# Patient Record
Sex: Male | Born: 1960
Health system: Southern US, Community
[De-identification: ages and names within clinical notes are randomized; demographics above are authoritative.]

## PROBLEM LIST (undated history)

## (undated) DIAGNOSIS — M169 Osteoarthritis of hip, unspecified: Secondary | ICD-10-CM

## (undated) DIAGNOSIS — E785 Hyperlipidemia, unspecified: Secondary | ICD-10-CM

## (undated) DIAGNOSIS — K589 Irritable bowel syndrome without diarrhea: Secondary | ICD-10-CM

## (undated) DIAGNOSIS — K219 Gastro-esophageal reflux disease without esophagitis: Secondary | ICD-10-CM

## (undated) DIAGNOSIS — F419 Anxiety disorder, unspecified: Secondary | ICD-10-CM

## (undated) DIAGNOSIS — I255 Ischemic cardiomyopathy: Secondary | ICD-10-CM

## (undated) DIAGNOSIS — I2102 ST elevation (STEMI) myocardial infarction involving left anterior descending coronary artery: Secondary | ICD-10-CM

## (undated) DIAGNOSIS — G479 Sleep disorder, unspecified: Secondary | ICD-10-CM

## (undated) DIAGNOSIS — R109 Unspecified abdominal pain: Secondary | ICD-10-CM

## (undated) DIAGNOSIS — I2510 Atherosclerotic heart disease of native coronary artery without angina pectoris: Secondary | ICD-10-CM

## (undated) DIAGNOSIS — F4323 Adjustment disorder with mixed anxiety and depressed mood: Secondary | ICD-10-CM

## (undated) DIAGNOSIS — R739 Hyperglycemia, unspecified: Secondary | ICD-10-CM

## (undated) DIAGNOSIS — F17200 Nicotine dependence, unspecified, uncomplicated: Secondary | ICD-10-CM

## (undated) HISTORY — DX: Hyperglycemia, unspecified: R73.9

## (undated) HISTORY — DX: Osteoarthritis of hip, unspecified: M16.9

## (undated) HISTORY — DX: ST elevation (STEMI) myocardial infarction involving left anterior descending coronary artery: I21.02

## (undated) HISTORY — DX: Ischemic cardiomyopathy: I25.5

## (undated) HISTORY — DX: Unspecified abdominal pain: R10.9

## (undated) HISTORY — DX: Hyperlipidemia, unspecified: E78.5

## (undated) HISTORY — PX: POLYPECTOMY: SHX149

## (undated) HISTORY — DX: Gastro-esophageal reflux disease without esophagitis: K21.9

## (undated) HISTORY — DX: Sleep disorder, unspecified: G47.9

## (undated) HISTORY — DX: Irritable bowel syndrome, unspecified: K58.9

## (undated) HISTORY — DX: Anxiety disorder, unspecified: F41.9

## (undated) HISTORY — DX: Adjustment disorder with mixed anxiety and depressed mood: F43.23

## (undated) HISTORY — DX: Atherosclerotic heart disease of native coronary artery without angina pectoris: I25.10

## (undated) HISTORY — PX: OTHER SURGICAL HISTORY: SHX169

## (undated) HISTORY — PX: CARDIAC CATHETERIZATION: SHX172

## (undated) HISTORY — DX: Nicotine dependence, unspecified, uncomplicated: F17.200

## (undated) HISTORY — PX: ROTATOR CUFF REPAIR: SHX139

---

## 1998-04-01 ENCOUNTER — Encounter: Payer: Self-pay | Admitting: Emergency Medicine

## 1998-04-01 ENCOUNTER — Emergency Department (HOSPITAL_COMMUNITY): Admission: EM | Admit: 1998-04-01 | Discharge: 1998-04-01 | Payer: Self-pay | Admitting: Emergency Medicine

## 1999-10-04 ENCOUNTER — Encounter: Payer: Self-pay | Admitting: Emergency Medicine

## 1999-10-04 ENCOUNTER — Emergency Department (HOSPITAL_COMMUNITY): Admission: EM | Admit: 1999-10-04 | Discharge: 1999-10-04 | Payer: Self-pay | Admitting: Emergency Medicine

## 1999-10-14 ENCOUNTER — Emergency Department (HOSPITAL_COMMUNITY): Admission: EM | Admit: 1999-10-14 | Discharge: 1999-10-15 | Payer: Self-pay | Admitting: Emergency Medicine

## 2001-03-01 ENCOUNTER — Ambulatory Visit (HOSPITAL_COMMUNITY): Admission: RE | Admit: 2001-03-01 | Discharge: 2001-03-01 | Payer: Self-pay | Admitting: Gastroenterology

## 2001-03-09 ENCOUNTER — Encounter: Payer: Self-pay | Admitting: Gastroenterology

## 2001-03-09 ENCOUNTER — Ambulatory Visit (HOSPITAL_COMMUNITY): Admission: RE | Admit: 2001-03-09 | Discharge: 2001-03-09 | Payer: Self-pay | Admitting: Gastroenterology

## 2001-06-16 ENCOUNTER — Encounter: Payer: Self-pay | Admitting: Gastroenterology

## 2001-06-16 ENCOUNTER — Ambulatory Visit (HOSPITAL_COMMUNITY): Admission: RE | Admit: 2001-06-16 | Discharge: 2001-06-16 | Payer: Self-pay | Admitting: Gastroenterology

## 2001-06-21 ENCOUNTER — Encounter: Admission: RE | Admit: 2001-06-21 | Discharge: 2001-06-21 | Payer: Self-pay | Admitting: Gastroenterology

## 2001-06-21 ENCOUNTER — Encounter: Payer: Self-pay | Admitting: Gastroenterology

## 2003-08-03 ENCOUNTER — Emergency Department (HOSPITAL_COMMUNITY): Admission: EM | Admit: 2003-08-03 | Discharge: 2003-08-04 | Payer: Self-pay | Admitting: Emergency Medicine

## 2004-02-12 ENCOUNTER — Ambulatory Visit: Admission: RE | Admit: 2004-02-12 | Discharge: 2004-02-12 | Payer: Self-pay | Admitting: Internal Medicine

## 2004-04-10 ENCOUNTER — Ambulatory Visit: Payer: Self-pay | Admitting: Pulmonary Disease

## 2004-04-10 ENCOUNTER — Ambulatory Visit (HOSPITAL_BASED_OUTPATIENT_CLINIC_OR_DEPARTMENT_OTHER): Admission: RE | Admit: 2004-04-10 | Discharge: 2004-04-10 | Payer: Self-pay | Admitting: Pulmonary Disease

## 2005-04-06 ENCOUNTER — Ambulatory Visit: Payer: Self-pay | Admitting: Internal Medicine

## 2005-04-22 ENCOUNTER — Ambulatory Visit: Payer: Self-pay | Admitting: Internal Medicine

## 2005-09-06 ENCOUNTER — Ambulatory Visit (HOSPITAL_COMMUNITY): Admission: RE | Admit: 2005-09-06 | Discharge: 2005-09-06 | Payer: Self-pay | Admitting: Vascular Surgery

## 2005-09-17 ENCOUNTER — Encounter (INDEPENDENT_AMBULATORY_CARE_PROVIDER_SITE_OTHER): Payer: Self-pay | Admitting: *Deleted

## 2005-09-17 ENCOUNTER — Ambulatory Visit (HOSPITAL_BASED_OUTPATIENT_CLINIC_OR_DEPARTMENT_OTHER): Admission: RE | Admit: 2005-09-17 | Discharge: 2005-09-17 | Payer: Self-pay | Admitting: Orthopedic Surgery

## 2006-03-08 ENCOUNTER — Ambulatory Visit (HOSPITAL_BASED_OUTPATIENT_CLINIC_OR_DEPARTMENT_OTHER): Admission: RE | Admit: 2006-03-08 | Discharge: 2006-03-08 | Payer: Self-pay | Admitting: Orthopedic Surgery

## 2006-03-10 ENCOUNTER — Ambulatory Visit: Payer: Self-pay | Admitting: Internal Medicine

## 2006-03-10 LAB — CONVERTED CEMR LAB
ALT: 45 units/L — ABNORMAL HIGH (ref 0–40)
AST: 36 units/L (ref 0–37)
Creatinine, Ser: 0.9 mg/dL (ref 0.4–1.5)
GFR calc non Af Amer: 98 mL/min
Glomerular Filtration Rate, Af Am: 118 mL/min/{1.73_m2}
HDL: 37.9 mg/dL — ABNORMAL LOW (ref >39.0)
LDL DIRECT: 149.8 mg/dL
Potassium: 4.1 meq/L (ref 3.5–5.1)
Triglyceride fasting, serum: 162 mg/dL — ABNORMAL HIGH (ref 0–149)
VLDL: 32 mg/dL (ref 0–40)

## 2006-03-21 ENCOUNTER — Ambulatory Visit: Payer: Self-pay | Admitting: Internal Medicine

## 2006-06-24 ENCOUNTER — Ambulatory Visit: Payer: Self-pay | Admitting: Internal Medicine

## 2006-07-22 ENCOUNTER — Ambulatory Visit: Payer: Self-pay | Admitting: Internal Medicine

## 2006-07-22 LAB — CONVERTED CEMR LAB
Cholesterol: 222 mg/dL (ref 0–200)
Total CHOL/HDL Ratio: 5

## 2006-07-28 ENCOUNTER — Ambulatory Visit: Payer: Self-pay | Admitting: Internal Medicine

## 2006-10-24 ENCOUNTER — Ambulatory Visit: Payer: Self-pay | Admitting: Internal Medicine

## 2006-10-24 LAB — CONVERTED CEMR LAB
Alkaline Phosphatase: 90 units/L (ref 39–117)
Basophils Absolute: 0 10*3/uL (ref 0.0–0.1)
CO2: 29 meq/L (ref 19–32)
Calcium: 8.9 mg/dL (ref 8.4–10.5)
Creatinine, Ser: 0.7 mg/dL (ref 0.4–1.5)
Eosinophils Absolute: 0.1 10*3/uL (ref 0.0–0.6)
GFR calc Af Amer: 158 mL/min
GFR calc non Af Amer: 130 mL/min
Glucose, Bld: 116 mg/dL — ABNORMAL HIGH (ref 70–99)
HCT: 43.4 % (ref 39.0–52.0)
HDL: 30.4 mg/dL — ABNORMAL LOW (ref >39.0)
Hemoglobin: 14.9 g/dL (ref 13.0–17.0)
MCHC: 34.3 g/dL (ref 30.0–36.0)
MCV: 94.1 fL (ref 78.0–100.0)
RBC: 4.62 M/uL (ref 4.22–5.81)
Sodium: 142 meq/L (ref 135–145)
Total CHOL/HDL Ratio: 6
VLDL: 31 mg/dL (ref 0–40)
WBC: 6.3 10*3/uL (ref 4.5–10.5)

## 2006-10-31 ENCOUNTER — Ambulatory Visit: Payer: Self-pay | Admitting: Internal Medicine

## 2006-11-12 ENCOUNTER — Ambulatory Visit: Payer: Self-pay | Admitting: Internal Medicine

## 2006-11-13 ENCOUNTER — Inpatient Hospital Stay (HOSPITAL_COMMUNITY): Admission: EM | Admit: 2006-11-13 | Discharge: 2006-11-14 | Payer: Self-pay | Admitting: Emergency Medicine

## 2006-12-02 ENCOUNTER — Telehealth: Payer: Self-pay | Admitting: Internal Medicine

## 2006-12-13 DIAGNOSIS — R109 Unspecified abdominal pain: Secondary | ICD-10-CM | POA: Insufficient documentation

## 2006-12-13 DIAGNOSIS — F411 Generalized anxiety disorder: Secondary | ICD-10-CM | POA: Insufficient documentation

## 2006-12-13 DIAGNOSIS — E785 Hyperlipidemia, unspecified: Secondary | ICD-10-CM

## 2006-12-13 DIAGNOSIS — R7309 Other abnormal glucose: Secondary | ICD-10-CM | POA: Insufficient documentation

## 2006-12-13 HISTORY — DX: Hyperlipidemia, unspecified: E78.5

## 2006-12-13 HISTORY — DX: Unspecified abdominal pain: R10.9

## 2007-01-05 ENCOUNTER — Ambulatory Visit: Payer: Self-pay | Admitting: Internal Medicine

## 2007-01-05 LAB — CONVERTED CEMR LAB
AST: 19 units/L (ref 0–37)
BUN: 11 mg/dL (ref 6–23)
Calcium: 9.2 mg/dL (ref 8.4–10.5)
Chloride: 108 meq/L (ref 96–112)
Cholesterol: 155 mg/dL (ref 0–200)
Creatinine, Ser: 0.8 mg/dL (ref 0.4–1.5)
GFR calc Af Amer: 135 mL/min
Sodium: 138 meq/L (ref 135–145)
Total CHOL/HDL Ratio: 5.4
VLDL: 22 mg/dL (ref 0–40)

## 2007-01-06 ENCOUNTER — Ambulatory Visit: Payer: Self-pay | Admitting: Gastroenterology

## 2007-01-10 ENCOUNTER — Ambulatory Visit (HOSPITAL_COMMUNITY): Admission: RE | Admit: 2007-01-10 | Discharge: 2007-01-10 | Payer: Self-pay | Admitting: Gastroenterology

## 2007-01-12 ENCOUNTER — Ambulatory Visit: Payer: Self-pay | Admitting: Internal Medicine

## 2007-01-19 ENCOUNTER — Ambulatory Visit: Payer: Self-pay | Admitting: Gastroenterology

## 2007-01-27 ENCOUNTER — Ambulatory Visit: Payer: Self-pay | Admitting: Gastroenterology

## 2007-02-27 ENCOUNTER — Ambulatory Visit: Payer: Self-pay | Admitting: Gastroenterology

## 2007-03-20 ENCOUNTER — Telehealth: Payer: Self-pay | Admitting: *Deleted

## 2007-03-22 DIAGNOSIS — M79609 Pain in unspecified limb: Secondary | ICD-10-CM | POA: Insufficient documentation

## 2007-03-28 ENCOUNTER — Encounter: Payer: Self-pay | Admitting: Internal Medicine

## 2007-04-07 ENCOUNTER — Encounter: Admission: RE | Admit: 2007-04-07 | Discharge: 2007-04-07 | Payer: Self-pay | Admitting: Orthopedic Surgery

## 2007-04-10 ENCOUNTER — Encounter: Payer: Self-pay | Admitting: Internal Medicine

## 2007-04-17 ENCOUNTER — Ambulatory Visit: Payer: Self-pay | Admitting: Internal Medicine

## 2007-04-17 LAB — CONVERTED CEMR LAB
AST: 24 units/L (ref 0–37)
Glucose, Bld: 111 mg/dL — ABNORMAL HIGH (ref 70–99)
Hgb A1c MFr Bld: 5.9 % (ref 4.6–6.0)
LDL Cholesterol: 98 mg/dL (ref 0–99)
Total CHOL/HDL Ratio: 4.1

## 2007-04-24 ENCOUNTER — Ambulatory Visit: Payer: Self-pay | Admitting: Internal Medicine

## 2007-04-24 DIAGNOSIS — M1610 Unilateral primary osteoarthritis, unspecified hip: Secondary | ICD-10-CM

## 2007-04-24 DIAGNOSIS — M479 Spondylosis, unspecified: Secondary | ICD-10-CM | POA: Insufficient documentation

## 2007-04-24 DIAGNOSIS — M169 Osteoarthritis of hip, unspecified: Secondary | ICD-10-CM | POA: Insufficient documentation

## 2007-04-24 DIAGNOSIS — K224 Dyskinesia of esophagus: Secondary | ICD-10-CM | POA: Insufficient documentation

## 2007-04-24 HISTORY — DX: Unilateral primary osteoarthritis, unspecified hip: M16.10

## 2007-04-24 HISTORY — DX: Osteoarthritis of hip, unspecified: M16.9

## 2007-07-17 ENCOUNTER — Encounter: Payer: Self-pay | Admitting: Gastroenterology

## 2007-07-24 ENCOUNTER — Ambulatory Visit: Payer: Self-pay | Admitting: Internal Medicine

## 2007-07-24 DIAGNOSIS — R7301 Impaired fasting glucose: Secondary | ICD-10-CM | POA: Insufficient documentation

## 2007-07-26 LAB — CONVERTED CEMR LAB
GFR calc non Af Amer: 111 mL/min
Glucose, Bld: 121 mg/dL — ABNORMAL HIGH (ref 70–99)
Hgb A1c MFr Bld: 5.8 % (ref 4.6–6.0)
Sodium: 142 meq/L (ref 135–145)

## 2007-07-31 ENCOUNTER — Ambulatory Visit: Payer: Self-pay | Admitting: Internal Medicine

## 2007-07-31 DIAGNOSIS — G479 Sleep disorder, unspecified: Secondary | ICD-10-CM

## 2007-07-31 HISTORY — DX: Sleep disorder, unspecified: G47.9

## 2007-08-03 DIAGNOSIS — F17200 Nicotine dependence, unspecified, uncomplicated: Secondary | ICD-10-CM

## 2007-08-03 HISTORY — DX: Nicotine dependence, unspecified, uncomplicated: F17.200

## 2007-12-15 ENCOUNTER — Ambulatory Visit: Payer: Self-pay | Admitting: Internal Medicine

## 2007-12-15 DIAGNOSIS — R519 Headache, unspecified: Secondary | ICD-10-CM | POA: Insufficient documentation

## 2007-12-15 DIAGNOSIS — IMO0001 Reserved for inherently not codable concepts without codable children: Secondary | ICD-10-CM | POA: Insufficient documentation

## 2007-12-15 DIAGNOSIS — R51 Headache: Secondary | ICD-10-CM | POA: Insufficient documentation

## 2007-12-21 ENCOUNTER — Telehealth (INDEPENDENT_AMBULATORY_CARE_PROVIDER_SITE_OTHER): Payer: Self-pay | Admitting: *Deleted

## 2007-12-22 LAB — CONVERTED CEMR LAB
ALT: 23 units/L (ref 0–53)
Basophils Absolute: 0 10*3/uL (ref 0.0–0.1)
Calcium: 9.3 mg/dL (ref 8.4–10.5)
Chloride: 108 meq/L (ref 96–112)
GFR calc Af Amer: 134 mL/min
Glucose, Bld: 98 mg/dL (ref 70–99)
MCHC: 34.6 g/dL (ref 30.0–36.0)
Monocytes Absolute: 0.5 10*3/uL (ref 0.1–1.0)
Neutro Abs: 4 10*3/uL (ref 1.4–7.7)
Platelets: 190 10*3/uL (ref 150–400)
Potassium: 3.8 meq/L (ref 3.5–5.1)
RBC: 4.22 M/uL (ref 4.22–5.81)
Total CK: 289 units/L (ref 7–195)

## 2008-01-12 ENCOUNTER — Ambulatory Visit: Payer: Self-pay | Admitting: Internal Medicine

## 2008-01-12 LAB — CONVERTED CEMR LAB: Total CK: 175 units/L (ref 7–195)

## 2008-01-19 ENCOUNTER — Ambulatory Visit: Payer: Self-pay | Admitting: Internal Medicine

## 2008-02-09 ENCOUNTER — Ambulatory Visit: Payer: Self-pay | Admitting: Internal Medicine

## 2008-02-09 DIAGNOSIS — T50995A Adverse effect of other drugs, medicaments and biological substances, initial encounter: Secondary | ICD-10-CM | POA: Insufficient documentation

## 2008-03-12 ENCOUNTER — Ambulatory Visit: Payer: Self-pay | Admitting: Internal Medicine

## 2008-03-12 DIAGNOSIS — R61 Generalized hyperhidrosis: Secondary | ICD-10-CM | POA: Insufficient documentation

## 2008-03-12 DIAGNOSIS — R079 Chest pain, unspecified: Secondary | ICD-10-CM | POA: Insufficient documentation

## 2008-03-14 ENCOUNTER — Telehealth: Payer: Self-pay | Admitting: *Deleted

## 2008-04-01 ENCOUNTER — Ambulatory Visit: Payer: Self-pay | Admitting: Internal Medicine

## 2008-04-03 ENCOUNTER — Encounter: Payer: Self-pay | Admitting: Internal Medicine

## 2008-05-02 ENCOUNTER — Telehealth: Payer: Self-pay | Admitting: Family Medicine

## 2008-05-31 ENCOUNTER — Ambulatory Visit: Payer: Self-pay | Admitting: Internal Medicine

## 2008-05-31 DIAGNOSIS — K219 Gastro-esophageal reflux disease without esophagitis: Secondary | ICD-10-CM | POA: Insufficient documentation

## 2008-05-31 HISTORY — DX: Gastro-esophageal reflux disease without esophagitis: K21.9

## 2008-09-02 ENCOUNTER — Ambulatory Visit: Payer: Self-pay | Admitting: Internal Medicine

## 2008-11-25 ENCOUNTER — Telehealth: Payer: Self-pay | Admitting: *Deleted

## 2008-11-25 ENCOUNTER — Ambulatory Visit: Payer: Self-pay | Admitting: Internal Medicine

## 2008-11-25 LAB — CONVERTED CEMR LAB
BUN: 18 mg/dL (ref 6–23)
CO2: 31 meq/L (ref 19–32)
Chloride: 107 meq/L (ref 96–112)
Creatinine, Ser: 0.8 mg/dL (ref 0.4–1.5)
GFR calc non Af Amer: 110.39 mL/min (ref >60)
Glucose, Bld: 110 mg/dL — ABNORMAL HIGH (ref 70–99)
HDL: 42.3 mg/dL (ref >39.00)
Hemoglobin: 14.9 g/dL (ref 13.0–17.0)
LDL Cholesterol: 124 mg/dL — ABNORMAL HIGH (ref 0–99)
Lymphocytes Relative: 33.3 % (ref 12.0–46.0)
MCV: 97.8 fL (ref 78.0–100.0)
Potassium: 4.3 meq/L (ref 3.5–5.1)
RBC: 4.53 M/uL (ref 4.22–5.81)
RDW: 12.9 % (ref 11.5–14.6)
Sodium: 144 meq/L (ref 135–145)
Total CHOL/HDL Ratio: 4
WBC: 7 10*3/uL (ref 4.5–10.5)

## 2008-12-02 ENCOUNTER — Ambulatory Visit: Payer: Self-pay | Admitting: Internal Medicine

## 2008-12-06 ENCOUNTER — Encounter: Admission: RE | Admit: 2008-12-06 | Discharge: 2008-12-06 | Payer: Self-pay | Admitting: Internal Medicine

## 2009-01-08 ENCOUNTER — Telehealth: Payer: Self-pay | Admitting: *Deleted

## 2009-06-15 ENCOUNTER — Encounter: Payer: Self-pay | Admitting: Internal Medicine

## 2009-08-18 ENCOUNTER — Telehealth: Payer: Self-pay | Admitting: *Deleted

## 2009-09-03 ENCOUNTER — Ambulatory Visit: Payer: Self-pay | Admitting: Internal Medicine

## 2009-09-03 DIAGNOSIS — R0789 Other chest pain: Secondary | ICD-10-CM | POA: Insufficient documentation

## 2009-09-03 DIAGNOSIS — R454 Irritability and anger: Secondary | ICD-10-CM | POA: Insufficient documentation

## 2009-09-11 ENCOUNTER — Telehealth: Payer: Self-pay | Admitting: *Deleted

## 2009-09-12 ENCOUNTER — Telehealth: Payer: Self-pay | Admitting: Internal Medicine

## 2009-10-09 ENCOUNTER — Telehealth: Payer: Self-pay | Admitting: Internal Medicine

## 2009-10-10 ENCOUNTER — Telehealth: Payer: Self-pay | Admitting: *Deleted

## 2009-10-29 ENCOUNTER — Telehealth: Payer: Self-pay | Admitting: *Deleted

## 2009-11-24 ENCOUNTER — Ambulatory Visit: Payer: Self-pay | Admitting: Internal Medicine

## 2010-01-19 ENCOUNTER — Telehealth: Payer: Self-pay | Admitting: *Deleted

## 2010-02-18 ENCOUNTER — Encounter: Payer: Self-pay | Admitting: Internal Medicine

## 2010-02-23 ENCOUNTER — Ambulatory Visit: Payer: Self-pay | Admitting: Internal Medicine

## 2010-02-23 DIAGNOSIS — F4323 Adjustment disorder with mixed anxiety and depressed mood: Secondary | ICD-10-CM | POA: Insufficient documentation

## 2010-02-23 HISTORY — DX: Adjustment disorder with mixed anxiety and depressed mood: F43.23

## 2010-03-26 ENCOUNTER — Ambulatory Visit: Payer: Self-pay | Admitting: Internal Medicine

## 2010-05-12 ENCOUNTER — Telehealth: Payer: Self-pay | Admitting: Internal Medicine

## 2010-05-13 ENCOUNTER — Ambulatory Visit
Admission: RE | Admit: 2010-05-13 | Discharge: 2010-05-13 | Payer: Self-pay | Source: Home / Self Care | Attending: Internal Medicine | Admitting: Internal Medicine

## 2010-05-13 ENCOUNTER — Encounter: Payer: Self-pay | Admitting: Internal Medicine

## 2010-05-13 ENCOUNTER — Other Ambulatory Visit: Payer: Self-pay | Admitting: Internal Medicine

## 2010-05-13 DIAGNOSIS — R634 Abnormal weight loss: Secondary | ICD-10-CM | POA: Insufficient documentation

## 2010-05-13 DIAGNOSIS — R252 Cramp and spasm: Secondary | ICD-10-CM | POA: Insufficient documentation

## 2010-05-13 LAB — BASIC METABOLIC PANEL
BUN: 24 mg/dL — ABNORMAL HIGH (ref 6–23)
CO2: 30 mEq/L (ref 19–32)
Calcium: 9.8 mg/dL (ref 8.4–10.5)
Chloride: 103 mEq/L (ref 96–112)
Creatinine, Ser: 0.7 mg/dL (ref 0.4–1.5)
GFR: 130.12 mL/min (ref >60.00)
Glucose, Bld: 108 mg/dL — ABNORMAL HIGH (ref 70–99)
Potassium: 4.2 mEq/L (ref 3.5–5.1)
Sodium: 139 mEq/L (ref 135–145)

## 2010-05-13 LAB — T4, FREE: Free T4: 0.91 ng/dL (ref 0.60–1.60)

## 2010-05-13 LAB — T3, FREE: T3, Free: 2.6 pg/mL (ref 2.3–4.2)

## 2010-05-13 LAB — CBC WITH DIFFERENTIAL/PLATELET
Basophils Absolute: 0 10*3/uL (ref 0.0–0.1)
Basophils Relative: 0.3 % (ref 0.0–3.0)
Eosinophils Absolute: 0.1 10*3/uL (ref 0.0–0.7)
Eosinophils Relative: 1.4 % (ref 0.0–5.0)
HCT: 46.1 % (ref 39.0–52.0)
Hemoglobin: 15.7 g/dL (ref 13.0–17.0)
Lymphocytes Relative: 27.1 % (ref 12.0–46.0)
Lymphs Abs: 2.1 10*3/uL (ref 0.7–4.0)
MCHC: 34.2 g/dL (ref 30.0–36.0)
MCV: 98.2 fl (ref 78.0–100.0)
Monocytes Absolute: 0.6 10*3/uL (ref 0.1–1.0)
Monocytes Relative: 7.4 % (ref 3.0–12.0)
Neutro Abs: 5 10*3/uL (ref 1.4–7.7)
Neutrophils Relative %: 63.8 % (ref 43.0–77.0)
Platelets: 196 10*3/uL (ref 150.0–400.0)
RBC: 4.69 Mil/uL (ref 4.22–5.81)
RDW: 13.5 % (ref 11.5–14.6)
WBC: 7.8 10*3/uL (ref 4.5–10.5)

## 2010-05-13 LAB — PSA: PSA: 0.51 ng/mL (ref 0.10–4.00)

## 2010-05-13 LAB — HEPATIC FUNCTION PANEL
ALT: 24 U/L (ref 0–53)
AST: 20 U/L (ref 0–37)
Albumin: 4.4 g/dL (ref 3.5–5.2)
Alkaline Phosphatase: 61 U/L (ref 39–117)
Bilirubin, Direct: 0.1 mg/dL (ref 0.0–0.3)
Total Bilirubin: 0.8 mg/dL (ref 0.3–1.2)
Total Protein: 7.6 g/dL (ref 6.0–8.3)

## 2010-05-13 LAB — CONVERTED CEMR LAB
Bilirubin Urine: NEGATIVE
CRP: 0 mg/dL (ref <0.6)
Urobilinogen, UA: 0.2
WBC Urine, dipstick: NEGATIVE

## 2010-05-13 LAB — HEMOGLOBIN A1C: Hgb A1c MFr Bld: 6 % (ref 4.6–6.5)

## 2010-05-13 LAB — LIPASE: Lipase: 30 U/L (ref 11.0–59.0)

## 2010-05-13 LAB — TSH: TSH: 0.59 u[IU]/mL (ref 0.35–5.50)

## 2010-05-14 LAB — MAGNESIUM: Magnesium: 2 mg/dL (ref 1.5–2.5)

## 2010-06-06 ENCOUNTER — Encounter: Payer: Self-pay | Admitting: Internal Medicine

## 2010-06-09 NOTE — Assessment & Plan Note (Signed)
Summary: discuss referral to specialist for circulation in hand/dm   Vital Signs:  Patient profile:   50 year old male Weight:      182 pounds Pulse rate:   88 / minute BP sitting:   110 / 70  (left arm) Cuff size:   regular  Vitals Entered By: [name], CMA (AAMA) (November 24, 2009 3:17 PM) CC: Pt needs a referral to a hand specialist because he doesn't like Dr. Teressa Senter. Pt tried to get an appt with Dr. Amanda Pea but they said that they wouldn't see him and couldn't give him a reason why even after reviewing the records. They just told him that the md refused to see him.   History of Present Illness: Ann Mauri comes in today   for opinion as above .    Hand: hx of surgery wein graft in 2001 Dr Teressa Senter  for hand numbness and ? clot   and ulnar   artery involvement from injury and ? occupation with vibration and powerr tools.   Wears a glove for padding He got better and then in the past months getting worse again with color change in middle fingers and numbenss and pain on the palms.   Alos a tiny dark spot on tip of middle finger that gets very tender and heals inbetween . Whe called to esta with [facility]    Dr Brunetta Genera office declined appt.   ? what to do .Marland Kitchen Right hand is causing problems but  not as bad as last time.  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    today   is a good day and hand is not cool or as painful  Sleep: mood  seroquel much better   remeron caused  sedation   . needs refills  .  GI  protonix   helping stomach and was able to do fried food   .    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  Preventive Screening-Counseling & Management  Alcohol-Tobacco     Alcohol drinks/day: 0     Smoking Status: quit     Year Quit: 5 years ago  Caffeine-Diet-Exercise     Caffeine use/day: one      Does Patient Exercise: yes     Type of exercise: set ups     Times/week: 1  Current Medications (verified): 1)  Protonix 40 Mg Tbec (Pantoprazole  Sodium) .Marland Kitchen.. 1 By Mouth Once Daily 2)  Seroquel 25 Mg Tabs (Quetiapine Fumarate) .Marland Kitchen.. 1 By Mouth At Bedtime  Allergies (verified): 1)  ! Codeine 2)  ! * Ambien  Past History:  Past medical, surgical, family and social histories (including risk factors) reviewed, and no changes noted (except as noted below).  Past Medical History: Reviewed history from 12/02/2008 and no changes required. Anxiety Hyperlipidemia recurrent abdominal pain   Hosp 8/08  low gb ej fx egd ct  elevated FBS  114 on 11/07      GERD  signs  ? if OSA 2005? [facility]   Consults  Dr. Carolynn Serve past  Past Surgical History: cardiolyte neg 06/06 Rotator cuff repair cardiac cath  right   ulnar  vein artery graft?   Past History:  Care Management: Pulmonary: [facility]  Family History: Reviewed history from 05/31/2008 and no changes required. Family History Diabetes 1st degree relative  mom had cva Mom had depression.  Maternal aunt bipolar .     no add.  Social History: Reviewed history from 09/03/2009 and no changes required. 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    Physical Exam  General:  alert, well-developed, and well-nourished.   in nad  Head:  normocephalic and atraumatic.   Eyes:  vision grossly intact.   Lungs:  normal respiratory effort and no intercostal retractions.   Heart:  normal rate and regular rhythm.   Msk:  right hand with   tiny callus like lesion dark on fingertip  nail is ok.   no focal atrophy  nail bed looks good   sensation seems ok.  Pulses:  pulses intact without delay   nl/ cap refill  Neurologic:  alert & oriented X3 and gait  normal.   Skin:  see hand exam  Psych:  Oriented X3, normally interactive, good eye contact, not anxious appearing, and not depressed appearing.   reviewed his OV notes with dr sypher from 2007    Impression & Recommendations:  Problem # 1:  HAND PAIN, RIGHT (ICD-729.5) Assessment Deteriorated vascular vs nerve type signs poss related to job and aggravation  of old injury ... Disc options.   no urgency today but agree with reeval .  See Dr Leda Min and can alsways get a second opinion.        Problem # 2:  IRRITABILITY (ZOX-096.04) Assessment: Improved with sleep does better on  seroquel at night  or remeron but  se of drowsiness  Problem # 3:  GASTROESOPHAGEAL REFLUX DISEASE (ICD-530.81) Assessment: Improved stable  on medication His updated medication list for this problem includes:    Protonix 40 Mg Tbec (Pantoprazole sodium) .Marland Kitchen... 1 by mouth once daily  Problem # 4:  SLEEP DISORDER (ICD-780.50) Assessment: Improved  Complete Medication List: 1)  Protonix 40 Mg Tbec (Pantoprazole sodium) .Marland Kitchen.. 1 by mouth once daily 2)  Seroquel 25 Mg Tabs (Quetiapine fumarate) .Marland Kitchen.. 1 by mouth at bedtime  Patient Instructions: 1)  change your appt to 2-3 month s from  now. 2)  Get dr Teressa Senter to see your hand again  . poss need second opinion. Prescriptions: SEROQUEL 25 MG TABS (QUETIAPINE FUMARATE) 1 by mouth at bedtime  #90 x 0   Entered and Authorized by:   Madelin Headings MD   Signed by:   Madelin Headings MD on 11/24/2009   Method used:   [facility]  [address] [id number]       [address]       [place], [place]  [address]       Ph: [phone] or [phone]       Fax: [phone]   RxID:   [id number]

## 2010-06-09 NOTE — Progress Notes (Signed)
Summary: Pt req a script for Protronix   Phone Note Call from Patient Call back at [phone] cell   Caller: Patient Summary of Call: Pt called and said that he would like a script for Protronix instead of Nexium. Pt has tried the Protronix and it works really well for acid reflux. [facility] [address]  Initial call taken by: Lucy Antigua,  August 18, 2009 10:00 AM  Follow-up for Phone Call        get status report on his symptoms  and when did her change med. Ok to fill for 30 days and then have OV before next refill  Follow-up by: Madelin Headings MD,  August 18, 2009 5:19 PM  Additional Follow-up for Phone Call Additional follow up Details #1::        Pt aware and will call back to schedule a follow up appt. Additional Follow-up by: [name], CMA (AAMA),  August 18, 2009 5:22 PM    New/Updated Medications: PROTONIX 40 MG TBEC (PANTOPRAZOLE SODIUM) 1 by mouth once daily Prescriptions: PROTONIX 40 MG TBEC (PANTOPRAZOLE SODIUM) 1 by mouth once daily  #30 x 0   Entered by:   [name], CMA (AAMA)   Authorized by:   Madelin Headings MD   Signed by:   [name], CMA (AAMA) on 08/18/2009   Method used:   [facility]  [address] [id number]       [address]       [place], [place]  [address]       Ph: [phone] or [phone]       Fax: [phone]   RxID:   [id number]

## 2010-06-09 NOTE — Progress Notes (Signed)
Summary: requesting a med change  Phone Note Call from Patient Call back at [phone]   Caller: Patient----voice mail Summary of Call: Pt does not like the Lunesta. Would like to go back on the Remeron, the dissolvable tablets. Would like for Carollee Herter to return call. Initial call taken by: Warnell Forester,  October 09, 2009 11:05 AM  Follow-up for Phone Call        Spoke to pt and the 1st dosage of Lunesta wasn't strong enough then the increase dosage of lunesta was too strong. It took him 2 hours in the am to recover. He would like to go back on remeron. Pt also wanted to let us know that his stomach meds are doing fine. Follow-up by: [name], CMA [name]),  October 09, 2009 11:44 AM  Additional Follow-up for Phone Call Additional follow up Details #1::        Per Dr. Fabian Sharp- Okay to go back on remeron. #30 with 2 refills. Rx sent to pharmacy.  Additional Follow-up by: [name], CMA (AAMA),  October 09, 2009 12:43 PM    New/Updated Medications: REMERON SOLTAB 30 MG TBDP (MIRTAZAPINE) 1 by mouth at bedtime Prescriptions: REMERON SOLTAB 30 MG TBDP (MIRTAZAPINE) 1 by mouth at bedtime  #30 x 2   Entered by:   [name], CMA (AAMA)   Authorized by:   Madelin Headings MD   Signed by:   [name], CMA (AAMA) on 10/09/2009   Method used:   [facility]  [address] [id number]       [address]       [place], [place]  [address]       Ph: [phone] or [phone]       Fax: [phone]   RxID:   [id number]

## 2010-06-09 NOTE — Assessment & Plan Note (Signed)
Summary: follow up re: Protonix and prostate/cjr   Vital Signs:  Patient profile:   50 year old male Height:      72 inches Weight:      170 pounds BMI:     23.14 Pulse rate:   70 / minute BP sitting:   100 / 70  (left arm) Cuff size:   regular  Vitals Entered By: [name], CMA [name]) (September 03, 2009 2:59 PM) CC: Follow-up visit on protonix. Pt also wants to discuss changing mirtazapine to something else for sleep due to it making him feel strange in am. Pt states that he has been irritable for the past 2 months. ? panic attacks   History of Present Illness: Tony Montoya comesin comes in today  for     follow up of his meds for a number of problems   since last visit in July of 2010  he was seen in urgent care for possible prostate uti problem  and labs were done and normal ( see scanned in EHR)  taking mirtazapine    3 x per months or so to help  sleep  which does but  gets some hangover symptom .  Then is very tired in the day . Stomach is  the ok.    But comes and goes   off and on  [place] ,   vomited.    then got sick  from gatorade.     No fever weight loss  sig alcohol or drugs .   Has had this problem for some time and does well at times and then flares . Ususally meds help him.   ( libra and   ppi)   Has had some chest pressure  at times  vague without sob  but newere  not today .  no syncope or edema.   Preventive Screening-Counseling & Management  Alcohol-Tobacco     Alcohol drinks/day: 0     Smoking Status: quit     Year Quit: 5 years ago  Caffeine-Diet-Exercise     Caffeine use/day: one      Does Patient Exercise: yes     Type of exercise: set ups     Times/week: 1  Current Medications (verified): 1)  Mirtazapine 30 Mg Tabs (Mirtazapine) .Marland Kitchen.. 1 By Mouth Hs 2)  Protonix 40 Mg Tbec (Pantoprazole Sodium) .Marland Kitchen.. 1 By Mouth Once Daily  Allergies (verified): 1)  ! Codeine 2)  ! * Ambien  Past History:  Past medical, surgical, family and social  histories (including risk factors) reviewed, and no changes noted (except as noted below).  Past Medical History: Reviewed history from 12/02/2008 and no changes required. Anxiety Hyperlipidemia recurrent abdominal pain   Hosp 8/08  low gb ej fx egd ct  elevated FBS  114 on 11/07      GERD  signs  ? if OSA 2005? [facility]   Consults  Dr. Carolynn Serve past  Past Surgical History: Reviewed history from 03/12/2008 and no changes required. cardiolyte neg 06/06 Rotator cuff repair cardiac cath   Family History: Reviewed history from 05/31/2008 and no changes required. Family History Diabetes 1st degree relative  mom had cva  Mom had depression.  Maternal aunt bipolar .      no add.          Social History: Reviewed history from 12/02/2008 and no changes required. 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 Adult Step son is a heroin addict and has gone through rehab but linving in [facility] and a source of great stress.  Review of Systems       The patient complains of anorexia and chest pain.  The patient denies fever, weight loss, weight gain, vision loss, decreased hearing, syncope, dyspnea on exertion, peripheral edema, prolonged cough, melena, hematochezia, hematuria, incontinence, genital sores, muscle weakness, transient blindness, unusual weight change, enlarged lymph nodes, angioedema, and testicular masses.    Physical Exam  General:  Well-developed,well-nourished,in no acute distress; alert,appropriate and cooperative throughout examination Head:  normocephalic and atraumatic.   Eyes:  vision grossly intact, pupils equal, and pupils round.   Neck:  No deformities, masses, or tenderness noted. Lungs:  Normal respiratory effort, chest expands symmetrically. Lungs are clear to auscultation, no crackles or wheezes.no dullness.   Heart:  Normal rate and regular rhythm. S1 and S2 normal  without gallop, murmur, click, rub or other extra sounds.no lifts.   Abdomen:  Bowel sounds positive,abdomen soft and non-tender without masses, organomegaly or  noted. Pulses:  pulses intact without delay   Extremities:  no clubbing cyanosis or edema  Neurologic:  alert & oriented X3, strength normal in all extremities, and gait normal.   no tremor Skin:  turgor normal, color normal, no petechiae, and no purpura.   Cervical Nodes:  No lymphadenopathy noted Psych:  Oriented X3, normally interactive, good eye contact, not anxious appearing, and not depressed appearing.  looks tired and a bit anxious   nl speech and cognition   Impression & Recommendations:  Problem # 1:  Hx of CHEST DISCOMFORT (ICD-786.59)  unclear  cause ? reflux ? stress   ekg shows no acute change but  first degree block .26 with rate 55     will monitor   Orders: EKG w/ Interpretation (93000)  Problem # 2:  SLEEP DISORDER (ICD-780.50) has hangover effect from   metirzapine     and sleep walking from  [name]    hasnt tried [name]   ok  for a trial   but    stop if has se.   other wise consider other options.   Problem # 3:  GASTROESOPHAGEAL REFLUX DISEASE (ICD-530.81)  The following medications were removed from the medication list:    Clidinium-chlordiazepoxide 2.5-5 Mg Caps (Clidinium-chlordiazepoxide) .Marland Kitchen... 1 by mouth three times a day as needed  stomach spasm His updated medication list for this problem includes:    Protonix 40 Mg Tbec (Pantoprazole sodium) .Marland Kitchen... 1 by mouth once daily  Problem # 4:  NERVOUSNESS/IRRITABILITY (ICD-799.2) hx of same  if stress and sleep related    Complete Medication List: 1)  Mirtazapine 30 Mg Tabs (Mirtazapine) .Marland Kitchen.. 1 by mouth hs 2)  Protonix 40 Mg Tbec (Pantoprazole sodium) .Marland Kitchen.. 1 by mouth once daily 3)  Lunesta 3 Mg Tabs (Eszopiclone) .Marland Kitchen.. 1 by mouth once daily  Patient Instructions: 1)  can try the lunesta and call  about how this works    2)  consider other optins  if needed. 3)  return office visit in 3 months or as needed.

## 2010-06-09 NOTE — Assessment & Plan Note (Signed)
Summary: 3 MONTH FUP//CCM pt rsc/njr   Vital Signs:  Patient profile:   50 year old male Weight:      172 pounds Pulse rate:   66 / minute BP sitting:   100 / 60  (right arm) Cuff size:   regular  Vitals Entered By: [name], CMA [name]) (February 23, 2010 3:25 PM) CC: Follow-up visit and discuss going on wellbutrin for depression. Pt states that he took paxil in the past but it messed up with his head.   History of Present Illness: Tony Montoya  comesin for above situation  . he feels more  depressed for about 4 months . and family member situation was a problem   that perons has moved out and this is better  a family member hasd been helped by wellbutrin > if this would help him.   Family issues.   and  wife moved to [place] for 2 years.    on a job.     abdomen  stable  .  worse  when   related to stress and lifestyle .  eating well and  limiting   mild.  Sleep ok on lunesta  but not the depression. No alcohol or mind altering substance . Had labs done for work and were ok.   Preventive Screening-Counseling & Management  Alcohol-Tobacco     Alcohol drinks/day: 0     Smoking Status: quit     Year Quit: 5 years ago  Caffeine-Diet-Exercise     Caffeine use/day: one      Does Patient Exercise: yes     Type of exercise: set ups     Times/week: 1  Safety-Violence-Stamper     Seat Belt Use: 100  Current Medications (verified): 1)  Protonix 40 Mg Tbec (Pantoprazole Sodium) .Marland Kitchen.. 1 By Mouth Once Daily 2)  Lunesta 2 Mg Tabs (Eszopiclone) .Marland Kitchen.. 1 By Mouth At Bedtime  Allergies (verified): 1)  ! Codeine 2)  ! * Ambien  Contraindications/Deferment of Procedures/Staging:    Test/Procedure: FLU VAX    Reason for deferment: patient declined   Past History:  Past medical, surgical, family and social histories (including risk factors) reviewed, and no changes noted (except as noted below).  Past Medical History: Reviewed history from 12/02/2008 and no changes  required. Anxiety Hyperlipidemia recurrent abdominal pain   Hosp 8/08  low gb ej fx egd ct  elevated FBS  114 on 11/07      GERD  signs  ? if OSA 2005? [facility]   Consults  Dr. Carolynn Serve past  Past Surgical History: Reviewed history from 11/24/2009 and no changes required. cardiolyte neg 06/06 Rotator cuff repair cardiac cath  right   ulnar  vein artery graft?   Past History:  Care Management: Pulmonary: [facility]  Family History: Reviewed history from 11/24/2009 and no changes required. Family History Diabetes 1st degree relative  mom had cva Mom had depression.  Maternal aunt bipolar .     no add.      Social History: Reviewed history from 11/24/2009 and no changes required. 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    Physical Exam  General:  Well-developed,well-nourished,in no acute distress; alert,appropriate and cooperative throughout examination Head:  normocephalic and atraumatic.   Eyes:  vision grossly intact.   Ears:  no external deformities.   Neck:  No deformities, masses, or tenderness noted. Lungs:  Normal respiratory effort, chest expands symmetrically. Lungs are clear to auscultation, no crackles or wheezes. Heart:  Normal rate and regular rhythm. S1 and S2 normal without gallop, murmur, click, rub or other extra sounds. Abdomen:  Bowel sounds positive,abdomen soft and non-tender without masses, organomegaly or  noted. Pulses:  pulses intact without delay   Extremities:  no clubbing cyanosis or edema   Neurologic:  alert & oriented X3 and gait normal.   non focal  Skin:  turgor normal, color normal, no ecchymoses, and no petechiae.   Cervical Nodes:  No lymphadenopathy noted Psych:  Oriented X3, memory intact for recent and remote, normally interactive, good eye contact, and not anxious appearing.  somewhat subdued cognition and speech nl    Impression & Recommendations:  Problem # 1:  ADJ DISORDER WITH MIXED ANXIETY & DEPRESSED MOOD (ICD-309.28) disc options an ok to try  wellbutrin but wont help the anxiety part as much   had se of paxil in the past   remeron and seoquel helped some but caused too much sedation ( when used for sleep)  Problem # 2:  ANXIETY (ICD-300.00)  His updated medication list for this problem includes:    Wellbutrin Xl 150 Mg Xr24h-tab (Bupropion hcl) .Marland Kitchen... 1 by mouth once daily for 7-10 days and then increase to 2 by mouth once daily in am  Problem # 3:  GASTROESOPHAGEAL REFLUX DISEASE (ICD-530.81) Assessment: Improved  His updated medication list for this problem includes:    Protonix 40 Mg Tbec (Pantoprazole sodium) .Marland Kitchen... 1 by mouth once daily  Complete Medication List: 1)  Protonix 40 Mg Tbec (Pantoprazole sodium) .Marland Kitchen.. 1 by mouth once daily 2)  Lunesta 2 Mg Tabs (Eszopiclone) .Marland Kitchen.. 1 by mouth at bedtime 3)  Wellbutrin Xl 150 Mg Xr24h-tab (Bupropion hcl) .Marland Kitchen.. 1 by mouth once daily for 7-10 days and then increase to 2 by mouth once daily in am  Patient Instructions: 1)  begin wellbutrin 150 24  per day and then increase to 2 per day. 2)  we can then   rx for 300mg  per day . 3)  rov in 1 month 4)  will get copy of  labs done for work Prescriptions: WELLBUTRIN XL 150 MG XR24H-TAB (BUPROPION HCL) 1 by mouth once daily for 7-10 days and then increase to 2 by mouth once daily in am  #40 x 0   Entered and Authorized by:   Madelin Headings MD   Signed by:   Madelin Headings MD on 02/23/2010   Method used:   Print then Give to Patient   RxID:    [id number]    Orders Added: 1)  Est. Patient Level IV [96295]   labs  from screening show TC 216 hdl 53 and ratio 4.1  Bp 132/65,

## 2010-06-09 NOTE — Progress Notes (Signed)
Summary: wants old rx  Phone Note Call from Patient Call back at [phone]   Caller: Patient--live call Summary of Call: wants to go back on Seroquel to help him sleep. the others make him feel wierd in the am. [facility] [address] [facility] [place]. Initial call taken by: Warnell Forester,  October 29, 2009 9:18 AM  Follow-up for Phone Call        Looking back at pt's chart he was on 2 different types of seroquel 25mg  1 at bedtime and could increase to 2 at bedtime or xr 50mg  1 once daily and could increased to 100 or 150mg . I left a message for pt to call back to clarify this dosage. Follow-up by: [name], CMA [name]),  October 29, 2009 11:53 AM  Additional Follow-up for Phone Call Additional follow up Details #1::        Pt states that he would like seroquel 25mg  1 by mouth at bedtime. Pt states that the other medication made him feel drunk the next day.  Additional Follow-up by: [name], CMA [name]),  October 29, 2009 1:22 PM    Additional Follow-up for Phone Call Additional follow up Details #2::    Per Dr. Fabian Sharp- okay to start back on seroquel 25mg  1 at bedtime. Follow-up by: [name], CMA [name]),  October 29, 2009 5:12 PM  Additional Follow-up for Phone Call Additional follow up Details #3:: Details for Additional Follow-up Action Taken: Pt aware and will call back to let us know how it works. Additional Follow-up by: [name], CMA (AAMA),  October 30, 2009 9:18 AM  New/Updated Medications: SEROQUEL 25 MG TABS (QUETIAPINE FUMARATE) 1 by mouth at bedtime Prescriptions: SEROQUEL 25 MG TABS (QUETIAPINE FUMARATE) 1 by mouth at bedtime  #30 x 0   Entered by:   [name], CMA (AAMA)   Authorized by:   Madelin Headings MD   Signed by:   [name], CMA (AAMA) on 10/30/2009   Method used:   [facility]  [address] [id number]       [address]       [place], [place]  [address]       Ph:  [phone] or [phone]       Fax: [phone]   RxID:   [id number]

## 2010-06-09 NOTE — Progress Notes (Signed)
  Phone Note Call from Patient Call back at [phone]   Caller: Patient Call For: Tony Headings MD Summary of Call: Pt does not like the Seroquel for sleep.  [facility]  Initial call taken by: Lynann Beaver CMA,  January 19, 2010 2:15 PM  Follow-up for Phone Call        ok to try lunesta 2 mg again. please delineate reason for change again.   rx 30 refill x 2   or samples  also  (  andcopay card) Follow-up by: Tony Headings MD,  January 20, 2010 5:05 PM  Additional Follow-up for Phone Call Additional follow up Details #1::        Spoke to pt and he wants rx called in as well.  Rx called in.  Additional Follow-up by: [name], CMA (AAMA),  January 21, 2010 2:30 PM    New/Updated Medications: LUNESTA 2 MG TABS (ESZOPICLONE) 1 by mouth at bedtime Prescriptions: LUNESTA 2 MG TABS (ESZOPICLONE) 1 by mouth at bedtime  #30 x 2   Entered by:   [name], CMA (AAMA)   Authorized by:   Tony Headings MD   Signed by:   [name], CMA (AAMA) on 01/21/2010   Method used:   Telephoned to ...       [facility]  [address] [id number]       [address]       [place], [place]  [address]       Ph: [phone] or [phone]       Fax: [phone]   RxID:   [id number]

## 2010-06-09 NOTE — Assessment & Plan Note (Signed)
Summary: 1 MONTH ROV/NJR   Vital Signs:  Patient profile:   50 year old male Weight:      170 pounds Pulse rate:   66 / minute BP sitting:   100 / 60  (right arm) Cuff size:   regular  Vitals Entered By: [name], CMA (AAMA) (March 26, 2010 2:03 PM) CC: Follow-up visit on meds- Pt d/c wellbutrin on 11/9 because it made him jittery and more depressed.   History of Present Illness: Tony Montoya comes in today  for  above  .  wellbutrin made him worse    and jitterey    and   more depressed .  and disoriented when he was on I 40.  stopped  and better.   still takes lunesta  off nad on and no se of this.   has moved to own place  smaller and drug addicted relative out of the picture     .  Has slept much better  recently and calmer .    feels less stressed and no longer that down.   feel transition tiggered this .  eating ok.     Gi stable   needs refill of  protonix.   Preventive Screening-Counseling & Management  Alcohol-Tobacco     Alcohol drinks/day: 0     Smoking Status: quit     Year Quit: 5 years ago  Caffeine-Diet-Exercise     Caffeine use/day: one      Does Patient Exercise: yes     Type of exercise: set ups     Times/week: 1  Safety-Violence-Zenk     Seat Belt Use: 100     Firearms in the Home: firearms in the home     Firearm Counseling: not indicated; uses recommended firearm safety measures     Smoke Detectors: yes  Current Medications (verified): 1)  Protonix 40 Mg Tbec (Pantoprazole Sodium) .Marland Kitchen.. 1 By Mouth Once Daily 2)  Lunesta 2 Mg Tabs (Eszopiclone) .Marland Kitchen.. 1 By Mouth At Bedtime  Allergies (verified): 1)  ! Codeine 2)  ! * Ambien 3)  Wellbutrin Xl (Bupropion Hcl)  Past History:  Past medical, surgical, family and social histories (including risk factors) reviewed, and no changes noted (except as noted below).  Past Medical History: Reviewed history from 12/02/2008 and no changes required. Anxiety Hyperlipidemia recurrent abdominal  pain   Hosp 8/08  low gb ej fx egd ct  elevated FBS  114 on 11/07      GERD  signs  ? if OSA 2005? [facility]   Consults  Dr. Carolynn Serve past  Past Surgical History: Reviewed history from 11/24/2009 and no changes required. cardiolyte neg 06/06 Rotator cuff repair cardiac cath  right   ulnar  vein artery graft?   Past History:  Care Management: Pulmonary: [facility]  Family History: Reviewed history from 11/24/2009 and no changes required. Family History Diabetes 1st degree relative  mom had cva Mom had depression.  Maternal aunt bipolar .     no add.      Social History: Reviewed history from 02/23/2010 and no changes required. 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    Physical Exam  General:  alert, well-developed, well-nourished, and well-hydrated.   Head:  normocephalic.   Psych:  Oriented X3, normally interactive, good eye contact, not anxious appearing, and not depressed appearing.  calmer  not agitated   coherent   Impression & Recommendations:  Problem # 1:  ADJ DISORDER WITH MIXED ANXIETY & DEPRESSED MOOD (ICD-309.28) Assessment Improved situation  has improved  big changes  .     doesnt do well on straight serotonin meds   has done better on remeron or   serequel in the past   but i agree  no add meds for now and see how things go.   counseled  acll with alarm  issues   Problem # 2:  ADVERSE REACTION TO MEDICATION (ZOX-096.04) wellbutrin  Problem # 3:  GASTROESOPHAGEAL REFLUX DISEASE (ICD-530.81) Assessment: Unchanged refill today  His updated medication list for this problem includes:     Protonix 40 Mg Tbec (Pantoprazole sodium) .Marland Kitchen... 1 by mouth once daily  Complete Medication List: 1)  Protonix 40 Mg Tbec (Pantoprazole sodium) .Marland Kitchen.. 1 by mouth once daily 2)  Lunesta 2 Mg Tabs (Eszopiclone) .Marland Kitchen.. 1 by mouth at bedtime  Patient Instructions: 1)  ok to stay off meds for now  except lunesta. 2)   if getting  depressed      again then return office visit .   Prescriptions: LUNESTA 2 MG TABS (ESZOPICLONE) 1 by mouth at bedtime  #30 x 2   Entered and Authorized by:   Madelin Headings MD   Signed by:   Madelin Headings MD on 03/26/2010   Method used:   Print then Give to Patient   RxID:   [id number] PROTONIX 40 MG TBEC (PANTOPRAZOLE SODIUM) 1 by mouth once daily  #90 Tablet x 1   Entered and Authorized by:   Madelin Headings MD   Signed by:   Madelin Headings MD on 03/26/2010   Method used:   [facility] to        [facility]  [address] [id number]       [address]       [place], [place]  [address]       Ph: [phone] or [phone]       Fax: [phone]   RxID:   [id number]    Orders Added: 1)  Est. Patient Level III [phone]

## 2010-06-09 NOTE — Progress Notes (Signed)
Summary: Pt unable to pick up samples can we call in rx  Phone Note Call from Patient Call back at [phone]   Caller: Patient Summary of Call: Pt called saying that he can't get over here to get the samples. Pt wants #10 tabs called [facility] [address] to try. Samples given back to Dr. Fabian Sharp. Initial call taken by: [name], CMA [name]),  Sep 12, 2009 11:34 AM  Follow-up for Phone Call        Per Dr. Fabian Sharp- okay to call in. Rx called in. Follow-up by: [name], CMA (AAMA),  Sep 12, 2009 11:44 AM    New/Updated Medications: LUNESTA 2 MG TABS (ESZOPICLONE) 1 by mouth at bedtime Prescriptions: LUNESTA 2 MG TABS (ESZOPICLONE) 1 by mouth at bedtime  #10 x 0   Entered by:   [name], CMA (AAMA)   Authorized by:   Madelin Headings MD   Signed by:   [name], CMA (AAMA) on 09/12/2009   Method used:   Telephoned to ...       [facility]  [address] [id number]       [address]       [place], [place]  [address]       Ph: [phone] or [phone]       Fax: [phone]   RxID:   [id number]

## 2010-06-09 NOTE — Progress Notes (Signed)
Summary: Lower dose of lunesta  Phone Note Call from Patient Call back at [phone]   Caller: Patient Summary of Call: Pt states lunesta does works but is too strong and would like a lower dose.  Initial call taken by: [name], CMA (AAMA),  Sep 11, 2009 11:23 AM  Follow-up for Phone Call        try samples of 2 mg        Follow-up by: Madelin Headings MD,  Sep 11, 2009 12:09 PM  Additional Follow-up for Phone Call Additional follow up Details #1::        Left message on machine about samples being up front. Additional Follow-up by: [name], CMA (AAMA),  Sep 11, 2009 12:17 PM    New/Updated Medications: LUNESTA 2 MG TABS (ESZOPICLONE)

## 2010-06-09 NOTE — Letter (Signed)
Summary: Health Screening Results  Health Screening Results   Imported By: Maryln Gottron 03/04/2010 14:08:52  _____________________________________________________________________  External Attachment:    Type:   Image     Comment:   External Document

## 2010-06-09 NOTE — Progress Notes (Signed)
Summary: refill change to 90 days supply  Phone Note From Pharmacy   Call[facility]  [address] [phone][facility] Summary of Call: Ins wants pt to do a 90 days on pantoprozole.  Initial call taken by: [name], CMA [name]),  October 10, 2009 4:29 PM  Follow-up for Phone Call        Rx sent to pharmacy for 90 days supply Follow-up by: [name], CMA [name]),  October 10, 2009 4:29 PM    Prescriptions: PROTONIX 40 MG TBEC (PANTOPRAZOLE SODIUM) 1 by mouth once daily  #90 x 0   Entered by:   [name], CMA (AAMA)   Authorized by:   Madelin Headings MD   Signed by:   [name], CMA (AAMA) on 10/10/2009   Method used:   [facility]        [facility]  [address] [id number]       [address]       [place], [place]  [address]       Ph: [phone] or [phone]       Fax: [phone]   RxID:   [id number]

## 2010-06-11 NOTE — Assessment & Plan Note (Signed)
Summary: cold, clammy and sweating at night/ssc   Vital Signs:  Patient profile:   50 year old male Weight:      164 pounds O2 Sat:      98 % on Room air Temp:     98.2 degrees F oral Pulse rate:   72 / minute BP sitting:   120 / 80  (right arm) Cuff size:   regular  Vitals Entered By: [name], CMA (AAMA) (May 13, 2010 9:08 AM)  O2 Flow:  Room air CC: Chills at night, can't sleep, chest tightness and ha on 1/3, no fever, some coughing and congestion. Cramping alot and would like to have his PSA checked.   History of Present Illness: Tony Montoya comes in today   with wife  for acute problem. visit  see above and phone note.  Midl chest tightness but not the main problem .  wakening with cold and clammy and chills for a few days .   no uti signs but some lbp and in the past had a prostate infection with similar signs . No cough change in bowel habist but stomach does   flare up.   Losing weight feels nauseous and feels depressed with his anxiety .Wife thiks couldbe from anxiety  depression taking protonix .  no recent librax  Sleep still taking lunesta as needed    Preventive Screening-Counseling & Management  Alcohol-Tobacco     Alcohol drinks/day: 0     Smoking Status: quit     Year Quit: 5 years ago  Caffeine-Diet-Exercise     Caffeine use/day: one      Does Patient Exercise: yes     Type of exercise: set ups     Times/week: 1  Current Medications (verified): 1)  Protonix 40 Mg Tbec (Pantoprazole Sodium) .Marland Kitchen.. 1 By Mouth Once Daily 2)  Lunesta 2 Mg Tabs (Eszopiclone) .Marland Kitchen.. 1 By Mouth At Bedtime  Allergies (verified): 1)  ! Codeine 2)  ! * Ambien 3)  Wellbutrin Xl (Bupropion Hcl)  Past History:  Past medical, surgical, family and social histories (including risk factors) reviewed, and no changes noted (except as noted below).  Past Medical History: Reviewed history from 12/02/2008 and no changes required. Anxiety Hyperlipidemia recurrent  abdominal pain   Hosp 8/08  low gb ej fx egd ct  elevated FBS  114 on 11/07      GERD  signs  ? if OSA 2005? [facility]   Consults  Dr. Carolynn Serve past  Past Surgical History: Reviewed history from 11/24/2009 and no changes required. cardiolyte neg 06/06 Rotator cuff repair cardiac cath  right   ulnar  vein artery graft?   Past History:  Care Management: Pulmonary: [facility]  Family History: Reviewed history from 11/24/2009 and no changes required. Family History Diabetes 1st degree relative  mom had cva Mom had depression.  Maternal aunt bipolar .     no add.      Social History: Reviewed history from 03/26/2010 and no changes required. 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  The patient denies prolonged cough, headaches, hemoptysis, melena, hematochezia, severe indigestion/heartburn, hematuria, incontinence, transient blindness, difficulty walking, unusual weight change, abnormal bleeding, enlarged lymph nodes, and angioedema.    Physical Exam  General:  alert, well-developed, well-nourished, and well-hydrated.  looks tired  Head:  Normocephalic and atraumatic without obvious abnormalities. No apparent alopecia or balding. Eyes:  PERRL, EOMs full, conjunctiva clear  Ears:  R ear normal and L ear normal.   Nose:  no external deformity, no external erythema, and no nasal discharge.   Mouth:  pharynx pink and moist.   Neck:  No deformities, masses, or tenderness noted. Chest Wall:  No deformities, masses, tenderness or gynecomastia noted. Lungs:  Normal respiratory effort, chest expands symmetrically. Lungs are clear to  auscultation, no crackles or wheezes. Heart:  Normal rate and regular rhythm. S1 and S2 normal without gallop, murmur, click, rub or other extra sounds. Abdomen:  Bowel sounds positive,abdomen soft and non-tender without masses, organomegaly or hernias noted. Rectal:  No external abnormalities noted. Normal sphincter tone. No rectal masses or tenderness. Prostate:  Prostate gland firm and smooth, no enlargement, nodularity, tenderness, mass, asymmetry or induration. Msk:  no joint swelling and no joint warmth.   Pulses:  pulses intact without delay   Neurologic:  alert & oriented X3, gait normal, and DTRs symmetrical and normal.  no clonus Skin:  turgor normal and color normal.   Cervical Nodes:  No lymphadenopathy noted Axillary Nodes:  No palpable lymphadenopathy Psych:  Oriented X3, memory intact for recent and remote, good eye contact, and not anxious appearing.  mildly depressed    Impression & Recommendations:  Problem # 1:  LOSS OF WEIGHT (ICD-783.21) ? if from  depression or other   checkk labs and c xray   Orders: TLB-BMP (Basic Metabolic Panel-BMET) (80048-METABOL) TLB-CBC Platelet - w/Differential (85025-CBCD) TLB-Hepatic/Liver Function Pnl (80076-HEPATIC) TLB-TSH (Thyroid Stimulating Hormone) (84443-TSH) TLB-A1C / Hgb A1C (Glycohemoglobin) (83036-A1C) TLB-PSA (Prostate Specific Antigen) (84153-PSA) TLB-Magnesium (Mg) (83735-MG) TLB-T4 (Thyrox), Free [phone]) TLB-T3, Free (Triiodothyronine) (84481-T3FREE) T-CRP (C-Reactive Protein) (66440) T-2 View CXR (71020TC) Specimen Handling (34742) Venipuncture (59563) TLB-Lipase (83690-LIPASE)  Problem # 2:  ADJ DISORDER WITH MIXED ANXIETY & DEPRESSED MOOD (ICD-309.28) more depressed at present    hx of se of meds  remeron helped but too drowsy in day.  seropquel so so .Marland Kitchen..paxil and wellbutrin had "opposite reaction"  unclear if can take serotonin meds .   will try low dose zoloft and if not  helpful or se sugg psych med  consults .  unclear if any  bipolar or atypical depression  or just secondary to extreme anxiety.     Problem # 3:  MUSCLE CRAMPS (ICD-729.82) leg foot right  and hand     on protonix  r/o mg issues Orders: TLB-BMP (Basic Metabolic Panel-BMET) (80048-METABOL) TLB-CBC Platelet - w/Differential (85025-CBCD) TLB-Hepatic/Liver Function Pnl (80076-HEPATIC) TLB-TSH (Thyroid Stimulating Hormone) (84443-TSH) TLB-A1C / Hgb A1C (Glycohemoglobin) (83036-A1C) TLB-PSA (Prostate Specific Antigen) (84153-PSA) TLB-Magnesium (Mg) (83735-MG) TLB-T4 (Thyrox), Free [phone] TLB-T3, Free (Triiodothyronine) (84481-T3FREE) T-CRP (C-Reactive Protein) (51884) T-2 View CXR (71020TC) Specimen Handling (16606) Venipuncture (30160) TLB-Lipase (83690-LIPASE)  Problem # 4:  GASTROESOPHAGEAL REFLUX DISEASE (ICD-530.81) Assessment: Unchanged  His updated medication list for this problem includes:    Protonix 40 Mg Tbec (Pantoprazole sodium) .Marland Kitchen... 1 by mouth once daily  Complete Medication List: 1)  Protonix 40 Mg Tbec (Pantoprazole sodium) .Marland Kitchen.. 1 by mouth once daily 2)  Lunesta 2 Mg Tabs (Eszopiclone) .Marland Kitchen.. 1 by mouth at bedtime 3)  Zoloft 50 Mg Tabs (Sertraline hcl) .... Take 1/2 by mouth once daily for 1 week then increase to 1 by mouth once daily  or as directed  Patient Instructions: 1)  You will be informed of lab results when available. i f  unrevealing begin zoloft as discussed  . 2)  Then rov in 3 weeks or as needed  3)  If  not improved .Marland KitchenMarland KitchenMarland KitchenMedication consult   4)  Dr Andee Poles ,  Dr Dub Mikes  Prescriptions: ZOLOFT 50 MG TABS (SERTRALINE HCL) take 1/2 by mouth once daily for 1 week then increase to 1 by mouth once daily  or as directed  #30 x 2   Entered and Authorized by:   Madelin Headings MD   Signed by:   Madelin Headings MD on 05/13/2010   Method used:   Print then Give to Patient   RxID:   [id number]    Orders Added: 1)  TLB-BMP (Basic Metabolic Panel-BMET) [80048-METABOL] 2)   TLB-CBC Platelet - w/Differential [85025-CBCD] 3)  TLB-Hepatic/Liver Function Pnl [80076-HEPATIC] 4)  TLB-TSH (Thyroid Stimulating Hormone) [84443-TSH] 5)  TLB-A1C / Hgb A1C (Glycohemoglobin) [83036-A1C] 6)  TLB-PSA (Prostate Specific Antigen) [84153-PSA] 7)  TLB-Magnesium (Mg) [83735-MG] 8)  TLB-T4 (Thyrox), Free [08657-QI6N] 9)  TLB-T3, Free (Triiodothyronine) [62952-W4XLKG] 10)  T-CRP (C-Reactive Protein) [23860] 11)  T-2 View CXR [71020TC] 12)  Specimen Handling [99000] 13)  Venipuncture [36415] 14)  TLB-Lipase [83690-LIPASE] 15)  Est. Patient Level IV [40102]    Laboratory Results   Urine Tests    Routine Urinalysis   Color: yellow Appearance: Clear Glucose: negative   (Normal Range: Negative) Bilirubin: negative   (Normal Range: Negative) Ketone: negative   (Normal Range: Negative) Spec. Gravity: 1.015   (Normal Range: 1.003-1.035) Blood: negative   (Normal Range: Negative) pH: 6.0   (Normal Range: 5.0-8.0) Protein: negative   (Normal Range: Negative) Urobilinogen: 0.2   (Normal Range: 0-1) Nitrite: negative   (Normal Range: Negative) Leukocyte Esterace: negative   (Normal Range: Negative)    Comments: [name], CMA (AAMA)  May 13, 2010 10:39 AM

## 2010-06-11 NOTE — Progress Notes (Signed)
Summary: not feeling well  Phone Note Call from Patient Call back at Home Phone [phone]   Caller: Patient Call For: Madelin Headings MD Reason for Call: Talk to Nurse, Talk to Doctor Details for Reason: not feeling well Summary of Call: patient is calling because he has not been sleeping well for 2 nights.  He feels "cold, clammy", sweating.  Denies any chest pain, SOB, or nausea.  patient would like to be seen if possilbe. Initial call taken by: Kern Reap CMA [name]),  May 12, 2010 9:01 AM  Follow-up for Phone Call        patient is calling back with chest tightness, SOB. Onset this morning.  No pain, numbness, or tingling in lt arm.  He is sweating alot and not sleeping well for the past 2 days.  Per Dr Lovell Sheehan it sounds viral and can wait for Nidhi Jacome Follow-up by: Alfred Levins, CMA,  May 12, 2010 12:14 PM  Additional Follow-up for Phone Call Additional follow up Details #1::        Per Dr. Fabian Sharp- can wait until 9:15am. Pt aware and appt made. Additional Follow-up by: [name], CMA (AAMA),  May 12, 2010 2:19 PM

## 2010-06-16 ENCOUNTER — Ambulatory Visit (INDEPENDENT_AMBULATORY_CARE_PROVIDER_SITE_OTHER): Payer: 59 | Admitting: Internal Medicine

## 2010-06-16 ENCOUNTER — Encounter: Payer: Self-pay | Admitting: Internal Medicine

## 2010-06-16 VITALS — BP 120/80 | HR 66 | Wt 174.0 lb

## 2010-06-16 DIAGNOSIS — K219 Gastro-esophageal reflux disease without esophagitis: Secondary | ICD-10-CM

## 2010-06-16 DIAGNOSIS — F4323 Adjustment disorder with mixed anxiety and depressed mood: Secondary | ICD-10-CM

## 2010-06-16 DIAGNOSIS — M6580 Other synovitis and tenosynovitis, unspecified site: Secondary | ICD-10-CM

## 2010-06-16 DIAGNOSIS — F411 Generalized anxiety disorder: Secondary | ICD-10-CM

## 2010-06-16 DIAGNOSIS — R7309 Other abnormal glucose: Secondary | ICD-10-CM

## 2010-06-16 DIAGNOSIS — G479 Sleep disorder, unspecified: Secondary | ICD-10-CM

## 2010-06-16 DIAGNOSIS — M778 Other enthesopathies, not elsewhere classified: Secondary | ICD-10-CM

## 2010-06-16 MED ORDER — NABUMETONE 750 MG PO TABS: 750.0000 mg | ORAL_TABLET | Freq: Two times a day (BID) | ORAL | Status: DC

## 2010-06-16 NOTE — Assessment & Plan Note (Signed)
Improved on 25 mg of Zoloft. He has had side effects of a number of other medications and is a little bit hesitant to increase the dose. I agree with him and his depression is better although still has some anxiety at baseline. We'll follow if problematic consider again psychiatric consult for medication review.

## 2010-06-16 NOTE — Assessment & Plan Note (Signed)
Slightly improved fasting blood sugar 108 hemoglobin A1c is 6.0. Continue lifestyle intervention.

## 2010-06-16 NOTE — Assessment & Plan Note (Signed)
No joint swelling or bursitis discussed mechanism of injury and overuse handout given okay to use conservative measures are present with iced relative rest change in activity and to try an anti-inflammatory with GI protection. Does continue on his Protonix. Expectant management.

## 2010-06-16 NOTE — Progress Notes (Signed)
  Subjective:    Patient ID: Tony Montoya, male    DOB: 1961-03-20, 50 y.o.   MRN: [id number] Patient comes in as followup for a number of issues. In regard to his depression he is now taking 1/2 of zoloft  And better  not crying a  . Still has some anxiety but is much better and sleeping better at night. He denies side effects of 25 mg and had some hesitation to increase the dose.  No se. His wife is back in [place] for now.  Sleep:  Is some better . lunesta as needed. His having problems with his left elbow. He has seen Dr. Harriet Masson low and had 2 injections for probable tennis elbow. He has some minor improvement but it is really hurting him now. No injury or swelling or redness. 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 Would like to try one. His stomach situation is stable at present. HPI    Review of Systems No chest pain shortness of breath currently vision hearing fever or weight loss. No bleeding. See history of present illness. There is an area on his left leg that seemed like a spider bite that comes and goes and the top [place] off has questions about this and it implications. There is no pain.    Objective:   Physical Exam  Constitutional: He appears well-developed and well-nourished. No distress.  HENT:  Head: Normocephalic.  Neck: Normal range of motion.  Musculoskeletal:       Left elbow: He exhibits normal range of motion, no swelling, no deformity and no laceration. tenderness (point tenderness at the lateral epicondyles no effusion or warmth.) found. Lateral epicondyle tenderness noted. No radial head, no medial epicondyle and no olecranon process tenderness noted.       Arms: Skin: No purpura noted. Lesion: small 2 mm flat scaly flesh-colored lesion on the anterior lower leg. No bleeding or vascular involvement. Possible FK. Rash is not papular, not nodular  and not vesicular. He is not diaphoretic. No pallor.  Psychiatric: His speech is normal. His affect is not labile and not inappropriate. He does not exhibit a depressed mood.       Much less anxious today and much calmer. Speech is normal behavior is not agitated and has normal thought content. There is no extra motor activities.   Reviewed labs with patient all normal except for slightly elevated FBS. His chest x-ray was normal except for some question of "very tiny" possible effusion.       Assessment & Plan:  Anxiety depression improved depression patient's hesitation to increase medicine because of history of side effects and I agree with this. Lateral epicondylitis status post injection of 2 cortisone shots discussed conservative treatment other options change in activity exercises. Okay to use anti-inflammatories with GI precautions. Reviewed laboratory studies today hyperglycemia and intervention. I am not concerned about a scaly area on his leg but we'll follow it this could be a seborrheic keratosis. Should check it at next visit.

## 2010-06-16 NOTE — Assessment & Plan Note (Signed)
Stable and cutting back on caffeine continue medication.

## 2010-06-16 NOTE — Assessment & Plan Note (Signed)
Still taking Lunesta as needed it appears to work without significant side effect.

## 2010-06-16 NOTE — Assessment & Plan Note (Signed)
Ongoing. Avoid excess caffeine and sugar.

## 2010-06-16 NOTE — Patient Instructions (Signed)
Epicondylitis, Lateral (Tennis Elbow) with Rehab  Lateral epicondylitis involves inflammation and pain around the outer portion of the elbow. The pain is caused by inflammation of the tendons in the forearm that  bring back (extend) the wrist. Lateral epicondylittis is also called tennis elbow, because it is very common in tennis players. However, it may occur in any individual who extends the wrist repetitively. If lateral epicondylitis is left untreated, it may become a chronic problem.   SYMPTOMS  Pain, tenderness, and inflammation on the outer (lateral) side of the elbow.  Pain or weakness with gripping activities.  Pain that increases with wrist twisting motions (playing tennis, using a screwdriver, opening a door or a jar).  Pain with lifting objects, including a coffee cup.   CAUSES  Lateral epicondylitis is caused by inflammation of the tendons that extend the wrist. Causes of injury may include:  Repetitive stress and strain on the muscles and tendons that extend the wrist.  Sudden change in activity level or intensity.  Incorrect grip in racquet sports.  Incorrect grip size of racquet (often too large).  Incorrect hitting position or technique (usually backhand, leading with the elbow).  Using a racket that is too heavy.   RISK INCREASES WITH   Sports or occupations that require repetitive and/or strenuous forearm and wrist movements (tennis, squash, racquetball, carpentry).  Poor wrist and forearm strength and flexibility.   Failure to warm up properly before activity.  Resuming activity before healing, rehabilitation, and conditioning are complete.   PREVENTIVE MEASURES   Warm up and stretch properly before activity.  Maintain physical fitness: l Strength, flexibility, and endurance. l Cardiovascular fitness.  Wear and use properly fitted equipment.  Learn and use proper technique and have a coach correct improper technique.  Wear a tennis elbow  (counterforce) brace.   PROGNOSIS The course of this condition depends on the degree of the injury. If treated properly, acute cases (symptoms lasting less than 4 weeks) are often resolved in 2 to 6 weeks. Chronic (longer lasting cases) often resolve in 3 to 6 months, but may require physical therapy.   POSSIBLE COMPLICATIONS   Frequently recurring symptoms, resulting in a chronic problem. Properly treating the problem the first time decreases frequency of recurrence.  Chronic inflammation, scarring tendon degeneration, and partial tendon tear, requiring surgery.  Delayed healing or resolution of symptoms.   GENERAL TREATMENT CONSIDERATIONS  Treatment first involves the use of ice and medicine, to reduce pain and inflammation. Strengthening and stretching exercises may help reduce discomfort, if performed regularly. These exercises may be performed at home, if the condition is an acute injury. Chronic cases may require a referral to a physical therapist for evaluation and treatment. Your caregiver may advise a corticosteroid injection, to help reduce inflammation. Rarely, surgery is needed.   MEDICATION:   If pain medicine is needed, nonsteroidal anti-inflammatory medicines (aspirin and ibuprofen), or other minor pain relievers (acetaminophen), are often advised.   Do not take pain medicine for 7 days before surgery.   Prescription pain relievers may be given, if your caregiver thinks they are needed. Use only as directed and only as much as you need.  Corticosteroid injections may be recommended. These injections should be reserved only for the most severe cases, because they can only be given a certain number of times.   HEAT AND COLD:   Cold treatment (icing) should be applied for 10 to 15 minutes every 2 to 3 hours for inflammation and pain, and immediately after  activity that aggravates your symptoms. Use ice packs or an ice massage.  Heat treatment may be used before performing  stretching and strengthening activities prescribed by your caregiver, physical therapist, or athletic trainer. Use a heat pack or a warm water soak.     SEEK MEDICAL CARE IF:  Symptoms get worse or do not improve in 2 weeks, despite treatment.     EXERCISES   RANGE OF MOTION AND STRETCHING EXERCISES - Epicondylitis, Lateral (Tennis Elbow) These exercises may help you when beginning to rehabilitate your injury.  Your symptoms may go away with or without further involvement from your physician, physical therapist or athletic trainer. While completing these exercises, remember:   Restoring tissue flexibility helps normal motion to return to the joints. This allows healthier, less painful movement and activity.  An effective stretch should be held for at least 30 seconds.  A stretch should never be painful. You should only feel a gentle lengthening or release in the stretched tissue.    RANGE OF MOTION - Wrist Flexion, Active-Assisted  Extend your __________ elbow with your fingers pointing down.*   Gently pull the back of your hand towards you, until you feel a gentle stretch on the top of your forearm.   Hold this position for __________ seconds.  Repeat __________ times.  Complete this exercise __________ times per day.    *If directed by your physician, physical therapist or athletic trainer, complete this stretch with your elbow bent, rather than extended.     RANGE OF MOTION - Wrist Extension, Active-Assisted   Extend your __________ elbow and turn your palm upwards.*  Gently pull your palm and fingertips back, so your wrist extends and your fingers point more toward the ground.    You should feel a gentle stretch on the inside of your forearm.  Hold this position for __________ seconds.  Repeat __________ times. Complete this exercise __________ times per day. *If directed by your physician, physical therapist or athletic trainer, complete this stretch with your elbow  bent, rather than extended.     STRETCH - Wrist Flexion   Place the back of your __________ hand on a tabletop, leaving your elbow slightly bent. Your fingers should point away from your body.  Gently press the back of your hand down onto the table by straightening your elbow. You should feel a stretch on the top of your forearm.   Hold this position for __________ seconds.  Repeat __________ times. Complete this stretch __________ times per day.     STRETCH - Wrist Extension   Place your __________ fingertips on a tabletop, leaving your elbow slightly bent. Your fingers should point backwards.  Gently press your fingers and palm down onto the table by straightening your elbow. You should feel a stretch on the inside of your forearm.   Hold this position for __________ seconds.  Repeat __________ times. Complete this stretch __________ times per day.      STRENGTHENING EXERCISES - Epicondylitis, Lateral (Tennis Elbow) These exercises may help you when beginning to rehabilitate your injury. They may resolve your symptoms with or without further involvement from your physician, physical therapist or athletic trainer. While completing these exercises, remember:   Muscles can gain both the endurance and the strength needed for everyday activities through controlled exercises.  Complete these exercises as instructed by your physician, physical therapist or athletic trainer. Increase the resistance and repetitions only as guided.  You may experience muscle soreness or fatigue, but the pain  or discomfort you are trying to eliminate should never worsen during these exercises. If this pain does get worse, stop and make sure you are following the directions exactly. If the pain is still present after adjustments, discontinue the exercise until you can discuss the trouble with your caregiver.     STRENGTH - Wrist Flexors  Sit with your __________ forearm palm-up and fully supported on a table  or countertop. Your elbow should be resting below the height of your shoulder. Allow your wrist to extend over the edge of the surface.   Loosely holding a __________ pound weight, or a piece of rubber exercise band or tubing, slowly curl your hand up toward your forearm.    Hold this position for __________ seconds. Slowly lower the wrist back to the starting position in a controlled manner. Repeat __________ times. Complete this exercise __________ times per day.      STRENGTH - Wrist Extensors  Sit with your __________ forearm palm-down and fully supported on a table or countertop. Your elbow should be resting below the height of your shoulder. Allow your wrist to extend over the edge of the surface.  Loosely holding a __________ pound weight, or a piece of rubber exercise band or tubing, slowly curl your hand up toward your forearm.    Hold this position for __________ seconds. Slowly lower the wrist back to the starting position in a controlled manner. Repeat __________ times. Complete this exercise __________ times per day.      STRENGTH - Ulnar Deviators  Stand with a ____________________ pound weight in your __________ hand, or sit while holding a rubber exercise band or tubing, with your healthy arm supported on a table or countertop.  Move your wrist, so that your pinkie travels toward your forearm and your thumb moves away from your forearm.  Hold this position for __________ seconds and then slowly lower the wrist back to the starting position. Repeat __________ times. Complete this exercise __________ times per day     STRENGTH - Radial Deviators  Stand with a __________ pound weight in your __________ hand, or sit while holding a rubber exercise band or tubing, with your injured arm supported on a table or countertop.  Raise your hand upward in front of you or pull up on the rubber tubing.  Hold this position for time301 seconds and then slowly lower the wrist back to  the starting position.  Repeat __________ times. Complete this exercise __________ times per day.     STRENGTH - Forearm Supinators   Sit with your __________ forearm supported on a table, keeping your elbow below shoulder height.  Rest your hand over the edge, palm down.   Gently grip a __________ oz. hammer or a soup ladle.   Without moving your elbow, slowly turn your palm and hand upward to a "thumbs-up" position.  Hold this position for __________ seconds. Slowly return to the starting position.  Repeat __________ times. Complete this exercise __________ times per day.     STRENGTH - Forearm Pronators   Sit with your __________ forearm supported on a table, keeping your elbow below shoulder height. Rest your hand over the edge, palm up.  Gently grip a __________ oz. hammer or a soup ladle.   Without moving your elbow, slowly turn your palm and hand upward to a "thumbs-up" position.  Hold this position for __________ seconds.  Slowly return to the starting position.  Repeat __________ times. Complete this exercise __________ times per day.  STRENGTH - Grip   Grasp a tennis ball, a dense sponge, or a large, rolled sock in your hand.  Squeeze as hard as you can, without increasing any pain.  Hold this position for __________ seconds. Release your grip slowly. Repeat __________ times. Complete this exercise __________ times per day.    STRENGTH - Elbow Extensors, Isometric   Stand or sit upright, on a firm surface. Place your __________ arm so that your palm faces your stomach, and it is at the height of your waist.  Place your opposite hand on the underside of your forearm. Gently push up as your __________ arm resists. Push as hard as you can with both arms, without causing any pain or movement at your __________ elbow.  Hold this stationary position for __________ seconds. Gradually release the tension in both arms. Allow your muscles to relax completely before  repeating.   Document Released: 04/26/2005  Document Re-Released: 02/20/2009 [facility] Patient Information 2011 [place], [place].

## 2010-08-11 ENCOUNTER — Emergency Department (HOSPITAL_COMMUNITY): Payer: No Typology Code available for payment source

## 2010-08-11 ENCOUNTER — Emergency Department (HOSPITAL_COMMUNITY)
Admission: EM | Admit: 2010-08-11 | Discharge: 2010-08-11 | Disposition: A | Discharge status: Home / Self Care | Payer: No Typology Code available for payment source | Attending: Emergency Medicine | Admitting: Emergency Medicine

## 2010-08-11 ENCOUNTER — Encounter (HOSPITAL_COMMUNITY): Payer: Self-pay | Admitting: Radiology

## 2010-08-11 DIAGNOSIS — R51 Headache: Secondary | ICD-10-CM | POA: Insufficient documentation

## 2010-08-11 DIAGNOSIS — R404 Transient alteration of awareness: Secondary | ICD-10-CM | POA: Insufficient documentation

## 2010-08-11 DIAGNOSIS — S139XXA Sprain of joints and ligaments of unspecified parts of neck, initial encounter: Secondary | ICD-10-CM | POA: Insufficient documentation

## 2010-08-11 DIAGNOSIS — Z79899 Other long term (current) drug therapy: Secondary | ICD-10-CM | POA: Insufficient documentation

## 2010-08-11 DIAGNOSIS — F341 Dysthymic disorder: Secondary | ICD-10-CM | POA: Insufficient documentation

## 2010-08-11 DIAGNOSIS — S46909A Unspecified injury of unspecified muscle, fascia and tendon at shoulder and upper arm level, unspecified arm, initial encounter: Secondary | ICD-10-CM | POA: Insufficient documentation

## 2010-08-11 DIAGNOSIS — R233 Spontaneous ecchymoses: Secondary | ICD-10-CM | POA: Insufficient documentation

## 2010-08-11 DIAGNOSIS — S4980XA Other specified injuries of shoulder and upper arm, unspecified arm, initial encounter: Secondary | ICD-10-CM | POA: Insufficient documentation

## 2010-08-11 DIAGNOSIS — S0990XA Unspecified injury of head, initial encounter: Secondary | ICD-10-CM | POA: Insufficient documentation

## 2010-08-11 DIAGNOSIS — S0180XA Unspecified open wound of other part of head, initial encounter: Secondary | ICD-10-CM | POA: Insufficient documentation

## 2010-08-11 DIAGNOSIS — M542 Cervicalgia: Secondary | ICD-10-CM | POA: Insufficient documentation

## 2010-08-11 DIAGNOSIS — M546 Pain in thoracic spine: Secondary | ICD-10-CM | POA: Insufficient documentation

## 2010-08-11 DIAGNOSIS — K219 Gastro-esophageal reflux disease without esophagitis: Secondary | ICD-10-CM | POA: Insufficient documentation

## 2010-08-11 DIAGNOSIS — M25519 Pain in unspecified shoulder: Secondary | ICD-10-CM | POA: Insufficient documentation

## 2010-08-11 LAB — HEPATIC FUNCTION PANEL
ALT: 38 U/L (ref 0–53)
AST: 42 U/L — ABNORMAL HIGH (ref 0–37)
Albumin: 3.7 g/dL (ref 3.5–5.2)
Bilirubin, Direct: 0.4 mg/dL — ABNORMAL HIGH (ref 0.0–0.3)
Total Bilirubin: 0.9 mg/dL (ref 0.3–1.2)

## 2010-08-11 LAB — CBC
HCT: 39.5 % (ref 39.0–52.0)
MCV: 95.4 fL (ref 78.0–100.0)
RBC: 4.14 MIL/uL — ABNORMAL LOW (ref 4.22–5.81)
WBC: 7.7 10*3/uL (ref 4.0–10.5)

## 2010-08-11 LAB — DIFFERENTIAL
Basophils Absolute: 0 10*3/uL (ref 0.0–0.1)
Lymphocytes Relative: 22 % (ref 12–46)
Lymphs Abs: 1.7 10*3/uL (ref 0.7–4.0)
Neutro Abs: 5.3 10*3/uL (ref 1.7–7.7)
Neutrophils Relative %: 70 % (ref 43–77)

## 2010-08-11 LAB — POCT I-STAT, CHEM 8
Chloride: 108 mEq/L (ref 96–112)
HCT: 42 % (ref 39.0–52.0)
Potassium: 3.6 mEq/L (ref 3.5–5.1)
Sodium: 141 mEq/L (ref 135–145)

## 2010-08-11 MED ORDER — IOHEXOL 300 MG/ML  SOLN
80.0000 mL | Freq: Once | INTRAMUSCULAR | Status: AC | PRN
  Administered 2010-08-11: 80 mL via INTRAVENOUS

## 2010-08-14 ENCOUNTER — Other Ambulatory Visit: Payer: Self-pay | Admitting: Internal Medicine

## 2010-08-17 ENCOUNTER — Inpatient Hospital Stay (INDEPENDENT_AMBULATORY_CARE_PROVIDER_SITE_OTHER)
Admission: RE | Admit: 2010-08-17 | Discharge: 2010-08-17 | Disposition: A | Discharge status: Home / Self Care | Payer: 59 | Source: Ambulatory Visit | Attending: Emergency Medicine | Admitting: Emergency Medicine

## 2010-08-17 DIAGNOSIS — Z4802 Encounter for removal of sutures: Secondary | ICD-10-CM

## 2010-08-19 ENCOUNTER — Other Ambulatory Visit: Payer: Self-pay | Admitting: Internal Medicine

## 2010-08-24 ENCOUNTER — Telehealth: Payer: Self-pay | Admitting: *Deleted

## 2010-08-24 MED ORDER — ESZOPICLONE 2 MG PO TABS: 2.0000 mg | ORAL_TABLET | Freq: Every day | ORAL | Status: DC

## 2010-08-24 NOTE — Telephone Encounter (Signed)
Per Dr. Panosh- ok x 1 Rx faxed to pharmacy 

## 2010-08-24 NOTE — Telephone Encounter (Signed)
Refills on lunesta 2mg  last filled on 02/10/10 #30

## 2010-09-10 ENCOUNTER — Telehealth: Payer: Self-pay | Admitting: *Deleted

## 2010-09-10 MED ORDER — SERTRALINE HCL 50 MG PO TABS: 50.0000 mg | ORAL_TABLET | Freq: Every day | ORAL | Status: DC

## 2010-09-10 NOTE — Telephone Encounter (Signed)
Pharmacy is requesting pt have a 90 days supply. Rx sent to pharmacy

## 2010-09-15 ENCOUNTER — Ambulatory Visit (INDEPENDENT_AMBULATORY_CARE_PROVIDER_SITE_OTHER): Payer: 59 | Admitting: Internal Medicine

## 2010-09-15 ENCOUNTER — Encounter: Payer: Self-pay | Admitting: Internal Medicine

## 2010-09-15 VITALS — BP 100/60 | HR 66 | Wt 174.0 lb

## 2010-09-15 DIAGNOSIS — T887XXA Unspecified adverse effect of drug or medicament, initial encounter: Secondary | ICD-10-CM

## 2010-09-15 DIAGNOSIS — G479 Sleep disorder, unspecified: Secondary | ICD-10-CM

## 2010-09-15 DIAGNOSIS — T50905A Adverse effect of unspecified drugs, medicaments and biological substances, initial encounter: Secondary | ICD-10-CM | POA: Insufficient documentation

## 2010-09-15 DIAGNOSIS — F4323 Adjustment disorder with mixed anxiety and depressed mood: Secondary | ICD-10-CM

## 2010-09-15 DIAGNOSIS — F411 Generalized anxiety disorder: Secondary | ICD-10-CM

## 2010-09-15 MED ORDER — ESZOPICLONE 2 MG PO TABS: 2.0000 mg | ORAL_TABLET | Freq: Every day | ORAL | Status: DC
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
## 2010-09-15 NOTE — Patient Instructions (Signed)
Try the viagra or cialis. Continue zoloft .   Can continue with lunesta.  Cpx as labs in about 6 months

## 2010-09-22 NOTE — Discharge Summary (Signed)
Tony Montoya, Tony Montoya               ACCOUNT NO.:  [id number]   MEDICAL RECORD NO.:  [id number]          PATIENT TYPE:  INP   LOCATION:  4742                         FACILITY:  [facility]   PHYSICIAN:  Valerie A. Felicity Coyer, MDDATE OF BIRTH:  10-07-1960   DATE OF ADMISSION:  11/12/2006  DATE OF DISCHARGE:  11/14/2006                               DISCHARGE SUMMARY   DIAGNOSES AT TIME OF DISCHARGE:  1. Acute on chronic abdominal pain.  2. Diabetes type 2, borderline.  3. Hypertension.  4. Sleep apnea will need outpatient follow-up.  Consider 2-D      echocardiogram.   HISTORY OF PRESENT ILLNESS:  Tony Montoya is a 50 year old male who was  admitted on November 13, 2006, with chief complaint of abdominal pain.  He  noted that his abdominal pain started at 11:00 a.m. on the day prior to  admission.  He recently had an endoscopy performed by Dr. Ewing Schlein, which  according to the patient showed a hiatal hernia.  He notes for the last  10 years he has a sensation that his food stops at approximately nipple  level and he notes that his only relief is a forced regurgitation.  He  is admitted for further evaluation and treatment.   PAST MEDICAL HISTORY:  1. Chronic abdominal pain times 10 years.  2. Have blood sugars  3. 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  4. Sleep apnea.  5. Renal cyst.  6. Elevated cholesterol.   COURSE OF HOSPITALIZATION:  Chronic abdominal pain.  The patient was  admitted and underwent and abdominal ultrasound which was negative for  cholelithiasis or cholecystitis.  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 The pancreatic pills were obscured by bowel gas. This  was followed by a CT of the abdomen and pelvis which noted 2.2 cm  enhancing focus in the posterior wall of the gastric body.  It was  questioned whether or not this might be in the region of previous  biopsy. He underwent HIDA scan which showed a gallbladder ejection  fraction of 37%.   A GI consult was requested and the patient was seen by  Dr. Matthias Hughs.  He felt that it was reasonable to discharge the patient to  home on the following day if tolerating p.o.'s There is also a question  of possibility of a trial of anticholinergic versus an endoscopic  ultrasound to rule out microlithiasis. The patient was discharged to  home on November 14, 2006.   MEDICATIONS:  At time of discharge on Remeron 30 mg nightly as before.   PERTINENT LABORATORY DATA:  BUN 13, creatinine 0.74, hemoglobin 15.4,  hematocrit 45.6.   DISPOSITION:  The patient was discharged to home.   FOLLOWUP:  The patient instructed follow up with Dr. Berniece Andreas in two  to three weeks and Dr. Ewing Schlein as needed.      Sandford Craze, NP      Raenette Rover. Felicity Coyer, MD  Electronically Signed    MO/MEDQ  D:  12/22/2006  T:  12/23/2006  Job:  [id number]

## 2010-09-22 NOTE — Consult Note (Signed)
Tony Montoya, Tony Montoya               ACCOUNT NO.:  [id number]   MEDICAL RECORD NO.:  [id number]          PATIENT TYPE:  INP   LOCATION:  4742                         FACILITY:  [facility]   PHYSICIAN:  John C. Madilyn Fireman, [name]    DATE OF BIRTH:  03-24-1961   DATE OF CONSULTATION:  11/13/2006  DATE OF DISCHARGE:                                 CONSULTATION   REASON FOR CONSULTATION:  Abdominal pain.   HISTORY OF ILLNESS:  The patient is a 50 year old white male who  presented to the emergency room after recurrence of a severe pain at the  xiphoid process coming and going in waves.  He states that he has had  chronic history of this pain for many years but had not had it to a  significant degree in 5 years.  However, he describes the pain in a  slightly different more epigastric location, as well as reflux symptoms  that had been bothering him recently, and he saw Dr. Ewing Schlein and had an  EGD on November 11, 2006, for this.  Reportedly, according to patient, he had  a hiatal hernia and a small mass or lesion that was felt to possibly  represent a fatty tumor that was biopsied.  The patient had experienced  a lot of gaseousness and flatus the day of and after the procedure and  then on the day of admission began having the aforementioned pain at the  xyphoid process, which was more severe than the pain he had been having.  He denied any nausea, vomiting, fever, chills, diarrhea or abdominal  distention.  He came to the emergency room and his pain did not respond  to injections of Dilaudid.  He had normal routine labs and a chest  __________ normal acute abdominal series but with a small density  adjacent to the left hemidiaphragm of unclear significance.  A CT scan  of the chest and abdomen was then done and was unremarkable except for a  small hypervascular lesion in the posterior gastric wall felt possibly  to correspond to the region of recent biopsy as well as bilateral renal  cysts.  An abdominal  ultrasound is ordered and pending.  The patient  continues to have pain coming in waves intermittently that doubles him  over and he states it makes him cry.   PAST MEDICAL HISTORY:  1. Chronic abdominal pain, as mentioned before.  2. Possible history of borderline diabetes.  3. Hyperlipidemia.  4. Known renal cysts.   SURGERIES:  1. Negative cardiac cath.  2. Rotator cuff surgery.   SOCIAL HISTORY:  1. The patient is self-employed, a Systems analyst.  2. He denies alcohol or tobacco use.  3. He is married.  4. He has 2 stepchildren.   MEDICATIONS:  Remeron and a proton pump inhibitor.   PHYSICAL EXAM:  A well-developed, well-nourished white male in no acute  distress.  HEART:  Regular rate and rhythm without murmur.  LUNGS:  Clear.  ABDOMEN:  Soft, nondistended, with normoactive bowel sounds.  No  hepatosplenomegaly, mass or guarding.   LABS:  CBC:  Amylase,  lipase and liver function test normal.   IMPRESSION:  1. Abdominal pain, unknown etiology.  2. Recent small gastric lesions on a recent EGD, biopsies pending.   PLAN:  1. Will obtain abdominal ultrasound to rule out gallstones.  2. Continue proton pump inhibitor for now, further workup is      indicated.           ______________________________  Everardo All. Madilyn Fireman, [name]     [facility]/MEDQ  D:  11/13/2006  T:  11/13/2006  Job:  [id number]   cc:   Petra Kuba, [name]  Neta Mends. Fabian Sharp, MD

## 2010-09-22 NOTE — Consult Note (Signed)
Tony Montoya, GJERDE               ACCOUNT NO.:  [id number]   MEDICAL RECORD NO.:  [id number]          PATIENT TYPE:  EMS   LOCATION:  MAJO                         FACILITY:  [facility]   PHYSICIAN:  Melissa L. Ladona Ridgel, MD  DATE OF BIRTH:  [birth year]   DATE OF CONSULTATION:  11/13/2006  DATE OF DISCHARGE:                                 CONSULTATION   CHIEF COMPLAINT:  Abdominal pain.   PRIMARY CARE PHYSICIAN:  [name], [name]   HISTORY OF PRESENT ILLNESS:  The patient is a 50 year old white male  with a past medical history significant for intermittent severe chronic  abdominal pain for the past 10 years.  The patient states that yesterday  around 50 he started with an episode.  The patient evidently had had an  EGD the day before and feels that this may have stimulated this recent  event.  The patient and his wife confirm that he has not had a recent  significant event in some time.  The patient relates that his hiatal  hernia was normal except that he was told that he had a hiatal hernia.  The patient describes for the past 10 years having on-and-off feelings  of the fact that food was getting stuck and he indicated that the  level of his chest where the food was stuck was at the level of left  nipple.  His only relief from these symptoms was to force himself to  regurgitate.  He states that the pain is made worse sometimes by eating,  but no particular food is indicated, and made better by vomiting as  stated.  Otherwise, there is no pattern to behavior other the fact that  bending over when he is excessively heavy causes him to have the same  feelings.   REVIEW OF SYSTEMS:  The patient is losing weight purposefully because he  states that this makes his abdomen feel better.  He does have nausea  with induced vomiting.  He has epigastric tenderness.  He denies fever  or chills.  He is not constipated.  All other review of systems are  negative.   PAST MEDICAL HISTORY:  1.  Chronic abdominal pain x10 years.  2. High blood sugars which have been controlled by changing his diet.      He said he has eliminated [facility] which he drinks frequently      over the course of the day.  3. Hyperlipidemia recently diagnosed and has been given medication      which he has not filled.  4. History of severe Salmonella infection treated with Terrial Rhodes,      [name], during which time he did lose a significant amount of weight.  5. 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  He required revascularization for      some improvement.  6. He also was diagnosed with sleep apnea but did not have a followup  appointment scheduled nor was he ever told that he needed a CPAP      machine.  7. The patient also has renal cysts per the old charts that have been      stable since 2003.   PAST SURGICAL HISTORY:  1. Right hand vascular surgery.  2. Rotator cuff repair under Dr. Thurston Hole.  3. Cardiac catheterization.  Reports of the cardiac catheterization      are unavailable to me.   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  He has no tobacco abuse history.  He  denies ethanol abuse history.  He is married, has 2 step-children, and  does occasionally use marijuana.   FAMILY HISTORY:  His Mom has hypertension and diabetes.  Dad has  asbestosis with diabetes.   CURRENT MEDICATIONS:  Remeron 30 mg daily.   Review of the medical records reveal a small-bowel followthrough in 2003  that was within normal limits.  CT scan of the abdomen has been  completed to follow up his renal cyst.  There is no other solid organ  issues.  He completed a sleep study, as stated, that shows severe sleep  apnea.   PHYSICAL EXAMINATION:  VITAL SIGNS: Temperature is 97, blood pressure  139/81, pulse 54, respirations 20,  saturation 96%.  GENERAL: At the time of exam, he is in no acute distress.  He has mild  memory deficits post receiving Dilaudid.  HEENT: He is normocephalic, atraumatic.  Pupils are equal, round, and  reactive to light.  Extraocular muscles are intact.  Mucous membranes  are moist.  NECK: Supple, there is no JVD, no lymphadenopathy, no carotid bruits.  CHEST: Decreased but clear to auscultation.  There are no rhonchi,  rales, or wheezes.  CARDIOVASCULAR: Regular rate and rhythm, regular S1, S2, no S3, S4, no  murmurs, rubs, or gallops.  ABDOMEN: Soft, tender in the epigastric area with a quiet abdomen, only  occasional slow bowel sounds are noted.  EXTREMITIES: No clubbing, cyanosis, or edema.  NEUROLOGIC: He has mild memory deficit status post Dilaudid use.  Power  appears to be intact.  DTRs are 2+, plantars are downgoing.   White count is 10.4, hemoglobin 15.4, hematocrit 45.6, and platelets of  206,000.  Sodium is 140, potassium 3.8, chloride 106, CO2 is 25, BUN is  13, creatinine 0.7, and glucose is 101.  EKG is pending.  AST and ALT  within normal limits.  His lipase is slightly elevated at 62.  Ethanol  level is less than 5.   ASSESSMENT AND PLAN:  This is a 50 year old white male with acute-on-  chronic abdominal pain, spasmodic in nature but also with an element of  increased pressure and with varying symptoms.  The patient requires self-  induced vomiting to relieve his symptoms.  1. Gastrointestinal: Unclear etiology for abdominal pain and      discomfort.  However, if a hiatal hernia is present as patient      states, sliding hernia could present with these symptoms.  Also,      pyloric sphincter dysfunction has similar presentation, and      gallbladder disease.  His slightly increased lipase, the      significance of which is unclear.  I will go ahead and repeat that      in the morning before making a formal diagnosis of pancreatitis.      We will continue  gastrointestinal cocktails q.8 h.  p.r.n. , ask him      to trial some NuLev 0.125 mg p.o. q.4 h. p.r.n. which was already      recommended by Dr. Ewing Schlein.  We will do the ultrasound of the abdomen      which was outstanding and scheduled to be done in the next couple      of weeks as well as his gallbladder.  We may, however, need to      consider a HIDA scan to assess actual dyskinesia.  GI has been      informed of the patient's need for admission and requested an      Internal Medicine admission with consultation to the GI Service.  I      therefore would request a followup consultation with Dr. Ewing Schlein,      question of whether we need to consider manometric evaluation or      other studies of the hiatal hernia.  For now, we will use p.r.n.      opiates in a limited fashion along with antispasmodics.  2. Endocrine: History of elevated blood sugars now normal on a      modified diet.  This can be followed up as an outpatient with Dr.      Fabian Sharp.  3. Cardiovascular: We will check an EKG to assess possible cardiac      reasons for his symptoms.  The patient should initiate      hypercholesterolemia medication at the time of discharge.  He was      just diagnosed and received a prescription but did not fill it.  If      there is any atypia in his EKG we may wish to pursue a 2D echo to      look at functional or heart issues that could contribute to his      pain.  4. Pulmonary: Severe sleep apnea that has not been followed up on.  If      an EKG is abnormal then consider      assessing the right side of the heart which will take the brunt of      the strain for his sleep apnea.  5. Genitourinary: No complaints.  6. Deep venous thrombosis: This will be with ambulation ad lib.      Melissa L. Ladona Ridgel, MD  Electronically Signed     MLT/MEDQ  D:  11/13/2006  T:  11/13/2006  Job:  [id number]   cc:   Neta Mends. Fabian Sharp, MD

## 2010-09-22 NOTE — Assessment & Plan Note (Signed)
[facility]                         GASTROENTEROLOGY OFFICE NOTE   NAME:Tony Montoya, Tony Montoya                      MRN:          [id number]  DATE:02/27/2007                            DOB:          Sep 15, 1960    PROBLEM:  Chest and abdominal pain.   Tony Montoya has returned for scheduled followup.  Upper GI series raised a  question of a gastric ulcer.  Upper endoscopy was entirely negative.  On  a regimen of Verapamil 240 mg a day he has been significantly better.  He has had only 1 episode of chest pain.  He attributes this to eating  cold shrimp.  If he eats cold shrimp then he has reproducible pain.  He  does complain of some lightheadedness with his Verapamil.   EXAMINATION:  Pulse 56, blood pressure 104/78, weight 172.   IMPRESSION:  Chest pain -- Likely secondary to esophageal spasm.   RECOMMENDATIONS:  Continue Verapamil but reduce it to 120 mg since he is  having some dizziness at the higher dose.     Barbette Hair. Arlyce Dice, MD,FACG  Electronically Signed    RDK/MedQ  DD: 02/27/2007  DT: 02/28/2007  Job #: [id number]   cc:   Neta Mends. Fabian Sharp, MD

## 2010-09-22 NOTE — Assessment & Plan Note (Signed)
[facility]                         GASTROENTEROLOGY OFFICE NOTE   NAME:Tony Montoya, Tony Montoya                      MRN:          [id number]  DATE:01/06/2007                            DOB:          05-17-1960    PROBLEM:  Abdominal pain.   HISTORY OF PRESENT ILLNESS:  Mr. Mathison is a 50 year old white male  referred through the courtesy of Dr. Fabian Sharp for evaluation. He has been  complaining of intermittent severe left chest pain. When this occurs, he  has a feeling of food sitting in his chest or stomach. The pain is  relieved by self-induced vomiting. He denies pyrosis, nausea. He  underwent an upper endoscopy by Dr. 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 Following his upper endoscopy, he complained of severe  periumbilical pain and was hospitalized. Details of the hospitalization  are not known, though he did undergo a CT of the abdomen and chest that  was basically unremarkable. Ultrasound was also unremarkable.  Altogether, he has been symptomatic for 2 or 3 months. He has lost 34  pounds during this time, though he initially was trying to diet. He now  avoids food because of pain. He is on no gastric irritants including non-  steroidal's. In between episodes of pain, he eats without any  difficulty.   PAST MEDICAL HISTORY:  Unremarkable.   FAMILY HISTORY:  Noncontributory.   MEDICATIONS:  _____Hyomax_____.   ALLERGIES:  OXYCODONE.   SOCIAL HISTORY:  He neither smokes or drinks. He is married and works in  Games developer.   REVIEW OF SYSTEMS:  Positive for fatigue.   PHYSICAL EXAMINATION:  VITAL SIGNS:  Pulse 64, blood pressure 98/66,  weight 173.  ABDOMEN:  He has a small reducible umbilical hernia.  HEENT: EOMI.  PERRLA.  Sclerae are anicteric.  Conjunctivae are pink.  NECK:  Supple without thyromegaly, adenopathy or carotid bruits.  CHEST:  Clear to  auscultation and percussion without adventitious  sounds.  CARDIAC:  Regular rhythm; normal S1 S2.  There are no murmurs, gallops  or rubs.  EXTREMITIES:  Full range of motion.  No cyanosis, clubbing or edema.  RECTAL:  Examination deferred.   IMPRESSION:  1. Episodic severe upper abdominal and left chest pain that is      possibly accompanied by dysphagia and relieved by vomiting. This      raises the question of a periesophageal hiatal hernia with      transient incarceration. Severe spasm of the esophagus or perhaps      the stomach is also a consideration. I think it is unlikely that he      has biliary tract disease or bowel obstruction.  2. Periumbilical pain, questionable secondary to small umbilical      hernia.   RECOMMENDATIONS:  Upper GI series. If this test is negative, I would  consider a trial of nitrates or calcium blocking agent for spasms, sine  he is not improved with anticholinergics.     Barbette Hair. Arlyce Dice, MD,FACG  Electronically Signed  RDK/MedQ  DD: 01/06/2007  DT: 01/08/2007  Job #: [id number]   cc:   Neta Mends. Fabian Sharp, MD

## 2010-09-25 NOTE — Op Note (Signed)
Tony Montoya, Tony Montoya               ACCOUNT NO.:  [id number]   MEDICAL RECORD NO.:  [id number]          PATIENT TYPE:  AMB   LOCATION:  DSC                          FACILITY:  [facility]   PHYSICIAN:  Cindee Salt, [name]       DATE OF BIRTH:  [birth year]   DATE OF PROCEDURE:  03/08/2006  DATE OF DISCHARGE:                                 OPERATIVE REPORT   PREOPERATIVE DIAGNOSIS:  Foreign body left index finger.   POSTOPERATIVE DIAGNOSIS:  Foreign body left index finger.   OPERATION:  Removal foreign body left index finger.   SURGEON:  Kuzma   SUMMARY:  Inman R.N.   ANESTHESIA:  Forearm based IV regional.   HISTORY:  The patient is a 50 year old male who suffered an injury to his  left index finger picking up a hood of a car.  He suffered a small piece of  fiberglass going into his left index finger.  He has been unable to retrieve  this.  He is admitted now with mild erythema about the area and no evidence  of flexor tenosynovitis but moderate pain in the area of the foreign body.  He is desirous of removal, is well aware of risks and complications.   PROCEDURE:  The patient having been seen in the preoperative area, the  extremity marked by both patient and surgeon, questions encouraged and  answered.   PROCEDURE:  The patient was brought to the operating room where a forearm-  based IV regional anesthetic was carried out without difficulty.  He was  prepped using DuraPrep, supine position, left arm free.  An oblique incision  was made over the entrance wound, carried down through subcutaneous tissue.  The foreign body was immediately encountered in the subcutaneous tissue and  removed without difficulty.  No further lesions were identified.  No  purulence was noted.  The wound was irrigated, loosely closed on the  margins, and packed in the central aspect where he had attempted to remove  this after debriding the area.  A sterile compressive dressing and splint to  the finger was  applied.  The patient tolerated the procedure well and was  taken to the recovery room for observation in satisfactory condition.  He is  discharged home to return to [facility] in 1 week on  Vicodin.           ______________________________  Cindee Salt, [name]     GK/MEDQ  D:  03/08/2006  T:  03/08/2006  Job:  [id number]

## 2010-09-25 NOTE — Procedures (Signed)
NAMERAYDON, CHAPPUIS NO.:  [id number]   MEDICAL RECORD NO.:  [id number]          PATIENT TYPE:  OUT   LOCATION:  SLEEP CENTER                 FACILITY:  [facility]   PHYSICIAN:  Marcelyn Bruins, [name] [facility] DATE OF BIRTH:  08/30/1960   DATE OF STUDY:  04/10/2004                              NOCTURNAL POLYSOMNOGRAM   LOCATION:  Sleep laboratory.   REFERRING PHYSICIAN:  Dr. Marcelyn Bruins.   INDICATION FOR STUDY:  Hypersomnia with sleep apnea.  Epworth score is 9.   SLEEP ARCHITECTURE:  Total sleep time is 75 minutes with sleep efficiency of  only 18%.  The patient had very frequent awakenings and arousals.  There was  significantly degreased REM and slow-wave sleep.  Sleep onset latency was  prolonged at 148 minutes and REM latency was very short at 35 minutes.   IMPRESSION:  1.  Severe obstructive/central sleep apnea with a total respiratory      disturbance index of 52 events per hour, and O2 saturation as low as      86%.  There were approximately twice as many obstructive events as      centrals.  2.  Mild snoring noted throughout the study.  3.  No clinically significant cardiac arrhythmias.      KC/MEDQ  D:  04/24/2004 11:47:40  T:  04/25/2004 13:54:58  Job:  [id number]

## 2010-09-25 NOTE — Op Note (Signed)
Tony Montoya, Tony Montoya               ACCOUNT NO.:  [id number]   MEDICAL RECORD NO.:  [id number]          PATIENT TYPE:  AMB   LOCATION:  DSC                          FACILITY:  [facility]   PHYSICIAN:  Katy Fitch. Montoya, [name] 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   OPERATION:  1.  Resection of thrombosed ulnar artery.  2.  Reversed cephalic vein graft to reconstruct ulnar artery, right hand and      wrist.   OPERATION SURGEON:  Tony Montoya   ASSISTANT:  Annye Rusk PA-C.   ANESTHESIA:  General by LMA.   SUPERVISING ANESTHESIOLOGIST:  Dr. 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 Tawanna Cooler Early for  evaluation and management of an ischemic right hand.   Tony Montoya had initially presented to Dr. Lesle Chris of [facility] and was noted to have cool, cyanotic-appearing fingers on the  right hand.   Dr. Cleta Alberts referred Tony Montoya to Dr. Arbie Cookey, who performed a provisional  diagnosis of a thromboembolic injury to the right hand.   Dr. 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   Tony Montoya did not have finger-threatening ischemia but did have loss of  turgor, mottling, and multiple  splinter hemorrhages.   Dr. Arbie Cookey referred Tony Montoya for an upper extremity orthopedic consult.  Our  clinical examination confirmed that he had no flow through his right ulnar  artery on Allen's testing and flow to his index finger and thumb on radial  testing.   We made arrangements for elective reconstruction of his ulnar artery at this  time.   Preoperatively, Tony Montoya was advised to begin aspirin as an antiplatelet  drug.  Postoperatively, we anticipate use of Plavix and will advise Mr.  Montoya and his family to be on watch for any bleeding complications of these  medications.   Tony Montoya who has not smoked for the past 4 years.  He  does enjoy a caffeine intake, primarily as [employer] soft drink.  We have  counseled him to avoid caffeine, chocolate, [facility], and any exposure  to nicotine in the postoperative period.   After informed consent, he is brought to the operating room at this time.   PROCEDURE:  Tony Montoya was brought to the operating room and  placed in  supine position on the operating table.   Following the induction of general anesthesia by LMA, the right arm was  prepped with Betadine soap and solution and sterilely draped.  Following  exsanguination of the right arm with an Esmarch bandage, an arterial  tourniquet on the proximal right brachium is inflated to 230 mmHg.   The procedure commenced with a curvilinear incision paralleling the path of  the ulnar artery from the hypothenar eminence to the mid palm.  This was  extended proximally with a Brunner zigzag incision across the distal wrist  flexion creases.   Subcutaneous tissues were carefully divided, taking care to identify the  palmaris brevis muscle.  This was released on its radial border and then  followed by exposure of the ulnar artery and ulnar nerve.   The artery was isolated proximal to the distal wrist flexion crease and  secured with a silicone vessel loop.   Distal dissection revealed clot in the  superficial palmar arch and extending into the common digital vessel to the  ring and small finger as well as the proper ulnar digital artery to the  small finger.   The proximal portion of the artery was taken down with micro scissors, and a  2 mm Fogarty catheter was used to remove clot proximally.  The artery was  resected proximally until we found stable intima.   The artery and its area of full thickness thrombosis was resected distally  to the level of the takeoff of the dorsal carpal arch vessel.   The artery was transected at this level, and the Fogarty catheter was once  again used to remove clot.  The distal arterial segments were irrigated with  Tony Montoya solution, and the Fogarty catheter was used repeatedly to perform clot  removal.   The intima proximally and distally was inspected with the operating  microscope followed by a decision to harvest a reverse cephalic vein.   A second incision was fashioned on the radial aspect of the distal forearm.  A small area of partial thickness skin injury from a welding burn was  resected in this incision.  The cephalic vein was identified and was  transected after suture ligation proximally.  We identified a trifurcation  that allowed [place] to identify the distal and proximal orientation of the  vessel to allow a reversed vein graft.   The cephalic vein was harvested and irrigated with Tony Montoya solution.   We subsequently closed the wound on the radial forearm with intradermal  through a Prolene and Steri-Strips.   The vein graft was then placed with back-wall-first technique utilizing 9-0  nylon and an operating microscope.  A very satisfactory reconstruction of  the ulnar artery was achieved.   After completion of the distal anastomosis, the tourniquet was released with  immediate capillary refill to the thumb, index finger and, within 1 minute, normal capillary refill and turgor of the long ring  and small fingers.   Capillary refill in the long ring and small fingers increased from absent to  within 2 seconds after 5 minutes.   Mr. Hua will be advised to begin Plavix 75 mg p.o. b.i.d. in addition to  his daily 81 mg aspirin.   The wound was irrigated.  Bleeding points were electrocauterized with  bipolar current followed by repair of the skin with subdermal sutures of 4-0  Vicryl and intradermal 3-0 Prolene segmental suture.   There were no apparent complications.   Mr. Spease was placed  in a compressive dressing with a volar plaster splint  maintaining the wrist in the neutral position.   For aftercare, he is discharged with prescriptions for Percocet 5 mg 1-2  tablets p.o. q.4-6h. p.r.n. pain, also Keflex 500 mg 1 p.o. q.8h. x4 days as  a prophylactic antibiotic, and the aforementioned Plavix and aspirin.      Katy Fitch Montoya, [name]  Electronically Signed     RVS/MEDQ  D:  09/17/2005  T:  09/18/2005  Job:  [id number]

## 2010-10-22 ENCOUNTER — Other Ambulatory Visit: Payer: Self-pay | Admitting: Internal Medicine

## 2010-11-13 ENCOUNTER — Other Ambulatory Visit: Payer: Self-pay | Admitting: Internal Medicine

## 2010-11-24 ENCOUNTER — Other Ambulatory Visit: Payer: Self-pay | Admitting: *Deleted

## 2010-11-24 MED ORDER — SILDENAFIL CITRATE 50 MG PO TABS: 50.0000 mg | ORAL_TABLET | ORAL | Status: DC | PRN

## 2010-11-24 NOTE — Telephone Encounter (Signed)
Spoke to pt and he needs a refill on Viagra to [facility] [address]

## 2010-11-24 NOTE — Telephone Encounter (Signed)
rx sent to pharmacy

## 2010-11-24 NOTE — Telephone Encounter (Signed)
Per Dr. Fabian Sharp- Viagra #10 with 3 refills.

## 2010-12-30 ENCOUNTER — Telehealth: Payer: Self-pay

## 2010-12-30 ENCOUNTER — Ambulatory Visit (INDEPENDENT_AMBULATORY_CARE_PROVIDER_SITE_OTHER): Payer: 59 | Admitting: Family Medicine

## 2010-12-30 ENCOUNTER — Encounter: Payer: Self-pay | Admitting: Family Medicine

## 2010-12-30 VITALS — BP 120/80 | HR 78 | Temp 98.4°F | Wt 178.0 lb

## 2010-12-30 DIAGNOSIS — L0390 Cellulitis, unspecified: Secondary | ICD-10-CM

## 2010-12-30 DIAGNOSIS — T63301A Toxic effect of unspecified spider venom, accidental (unintentional), initial encounter: Secondary | ICD-10-CM

## 2010-12-30 DIAGNOSIS — T63391A Toxic effect of venom of other spider, accidental (unintentional), initial encounter: Secondary | ICD-10-CM

## 2010-12-30 DIAGNOSIS — L0291 Cutaneous abscess, unspecified: Secondary | ICD-10-CM

## 2010-12-30 DIAGNOSIS — T6391XA Toxic effect of contact with unspecified venomous animal, accidental (unintentional), initial encounter: Secondary | ICD-10-CM

## 2010-12-30 MED ORDER — DOXYCYCLINE HYCLATE 100 MG PO CAPS: 100.0000 mg | ORAL_CAPSULE | Freq: Two times a day (BID) | ORAL | Status: AC

## 2010-12-30 NOTE — Progress Notes (Signed)
  Subjective:    Patient ID: Tony Montoya, male    DOB: 1960/06/20, 50 y.o.   MRN: [id number]  HPI Here for 3 days of a painful lesion on the right lower leg, apparently a bite of some sort. No fever, but he has a HA and is slightly nauseated. He moved into a new house this week, ands he has been cleaning and unpacking boxes.    Review of Systems  Constitutional: Negative.   Respiratory: Negative.   Cardiovascular: Negative.   Skin: Positive for wound.       Objective:   Physical Exam  Constitutional: He appears well-developed and well-nourished.  Cardiovascular: Normal rate, regular rhythm, normal heart sounds and intact distal pulses.   Pulmonary/Chest: Effort normal and breath sounds normal.  Skin:       The lower right shin has a punctate wound surrounded by a zone of erythema, warmth, puffiness, and tenderness about 8 cm in diameter. Not fluctuant          Assessment & Plan:  This is probably a spider bite, most likely a brown recluse. Use warm Epsom salt soaks and Motrin for discomfort. Use Doxycyline for 14 days. Recheck prn

## 2010-12-30 NOTE — Telephone Encounter (Signed)
Pt was bitten by something on left ankle yesterday. Pt unaware of what bit him but area is swollen, red and painful. Advised pt to go to [facility] for evaluation but pt preferred to visit office. Appointment scheduled to see Dr. Clent Ridges today

## 2011-02-12 ENCOUNTER — Telehealth: Payer: Self-pay | Admitting: *Deleted

## 2011-02-12 MED ORDER — PANTOPRAZOLE SODIUM 40 MG PO TBEC: 40.0000 mg | DELAYED_RELEASE_TABLET | Freq: Every day | ORAL | Status: DC

## 2011-02-12 NOTE — Telephone Encounter (Signed)
Pt needs a refill on protonix and letter from [place] saying that he has been on depression medication to help with the short sale of their home. It needs to say that we have been treating pt for depression since 02/23/10 to present. Pt states to address this To Whom It May Concern.   Rx sent to pharmacy and pt knows that it may be another 1 to 2 weeks before we get the letter done.

## 2011-02-17 NOTE — Telephone Encounter (Signed)
Letter has been done but cannot connect  [employer] computer to to printer. Please print  Letter to send

## 2011-02-18 NOTE — Telephone Encounter (Signed)
Left message on machine that rx is ready to pick up. 

## 2011-02-23 LAB — DIFFERENTIAL
Basophils Absolute: 0
Eosinophils Relative: 1
Lymphocytes Relative: 32
Lymphs Abs: 3.3
Neutro Abs: 6.2
Neutrophils Relative %: 60

## 2011-02-23 LAB — COMPREHENSIVE METABOLIC PANEL
AST: 25
BUN: 13
CO2: 25
Calcium: 9.6
Chloride: 106
Creatinine, Ser: 0.74
GFR calc Af Amer: >60
GFR calc non Af Amer: >60
Total Bilirubin: 0.8

## 2011-02-23 LAB — LIPASE, BLOOD
Lipase: 50
Lipase: 62 — ABNORMAL HIGH

## 2011-02-23 LAB — BASIC METABOLIC PANEL
BUN: 14
Chloride: 107
GFR calc Af Amer: >60
GFR calc non Af Amer: >60
Potassium: 3.5
Sodium: 142

## 2011-02-23 LAB — CBC
HCT: 45.6
MCHC: 33.7
MCV: 94.4
RBC: 4.84

## 2011-03-02 ENCOUNTER — Telehealth: Payer: Self-pay | Admitting: *Deleted

## 2011-03-02 NOTE — Telephone Encounter (Signed)
FYI: Patient states he has been bitten by a "spider on his arm again" [seen Dr Clent Ridges in August 2012 for spider bite on leg]. Patient states bite occurred yesterday but arm has developed "streaks going up it" today. Other symptoms include: low-grade fever and headache. Patient advised to report to ED or Urgent Care for assessment and/or evaluation and to call for f/u office visit if felt necessary after being treated. Patient understood and agreed.

## 2011-04-23 ENCOUNTER — Ambulatory Visit (INDEPENDENT_AMBULATORY_CARE_PROVIDER_SITE_OTHER): Payer: 59

## 2011-04-23 DIAGNOSIS — L03119 Cellulitis of unspecified part of limb: Secondary | ICD-10-CM

## 2011-04-23 DIAGNOSIS — L02519 Cutaneous abscess of unspecified hand: Secondary | ICD-10-CM

## 2011-06-15 ENCOUNTER — Other Ambulatory Visit: Payer: No Typology Code available for payment source

## 2011-06-22 ENCOUNTER — Telehealth: Payer: Self-pay | Admitting: *Deleted

## 2011-06-22 ENCOUNTER — Encounter: Payer: No Typology Code available for payment source | Admitting: Internal Medicine

## 2011-06-22 NOTE — Telephone Encounter (Signed)
I called pt because he no showed. Pt states that he has been running a fever x 2 days. He states that he will try to take care of this himself but if he gets worse, then he will call back if he gets worse.

## 2011-07-02 NOTE — Telephone Encounter (Signed)
No charge for NS

## 2011-07-03 ENCOUNTER — Other Ambulatory Visit: Payer: Self-pay | Admitting: Internal Medicine

## 2011-10-25 ENCOUNTER — Other Ambulatory Visit: Payer: Self-pay | Admitting: Internal Medicine

## 2011-10-25 ENCOUNTER — Telehealth: Payer: Self-pay | Admitting: Internal Medicine

## 2011-10-25 ENCOUNTER — Ambulatory Visit (INDEPENDENT_AMBULATORY_CARE_PROVIDER_SITE_OTHER): Payer: 59 | Admitting: Family

## 2011-10-25 ENCOUNTER — Encounter: Payer: Self-pay | Admitting: Family

## 2011-10-25 VITALS — BP 130/80 | Temp 98.4°F | Wt 188.0 lb

## 2011-10-25 DIAGNOSIS — L0390 Cellulitis, unspecified: Secondary | ICD-10-CM

## 2011-10-25 DIAGNOSIS — L0291 Cutaneous abscess, unspecified: Secondary | ICD-10-CM

## 2011-10-25 DIAGNOSIS — S30860A Insect bite (nonvenomous) of lower back and pelvis, initial encounter: Secondary | ICD-10-CM

## 2011-10-25 DIAGNOSIS — T148XXA Other injury of unspecified body region, initial encounter: Secondary | ICD-10-CM

## 2011-10-25 DIAGNOSIS — W57XXXA Bitten or stung by nonvenomous insect and other nonvenomous arthropods, initial encounter: Secondary | ICD-10-CM

## 2011-10-25 MED ORDER — DOXYCYCLINE HYCLATE 100 MG PO TABS: 100.0000 mg | ORAL_TABLET | Freq: Two times a day (BID) | ORAL | Status: AC
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
## 2011-10-25 NOTE — Patient Instructions (Signed)
[place] Spotted Fever °[place] Spotted Fever (RMSF) is the oldest known tick-borne disease of people in the [place]. This disease was named because it was first described among people in the [place] area who had an illness characterized by a rash with red-purple-black spots. This disease is caused by a rickettsia (Rickettsia rickettsii), a bacteria carried by the tick. °The [place] wood tick and the American dog tick, acquire and transmit the RMSF bacteria (pictures NOT actual size). When a larval, nymphal or adult tick feeds on an infected rodent or larger animal, the tick can become infected. Infected adult ticks then feed on people who may then get RMSF. The tick transmits the disease to humans during a prolonged period of feeding that lasts many hours, days or even a couple weeks. The bite is painless and frequently goes unnoticed. An infected male tick may also pass the rickettsial bacteria to her eggs that then may mature to be infected adult ticks. °The rickettsia that causes RMSF can also get into a person's body through damaged skin. A tick bite is not necessary. People can get RMSF if they crush a tick and get it's blood or body fluids on their skin through a small cut or sore.  °DIAGNOSIS °Diagnosis is made by laboratory tests.  °TREATMENT °Treatment is with antibiotics (medications that kill rickettsia and other bacteria). Immediate treatment usually prevents death. °GEOGRAPHIC RANGE °This disease was reported only in the [place] until 1931. RMSF has more recently been described among individuals in all states except [place], [place] and [place]. The highest reported incidences of RMSF now occur among residents of [place], [place], [place] and the [place]. °TIME OF YEAR  °Most cases are diagnosed during late spring and summer when ticks are most active. However, especially in the warmer southern states, a few cases occur during the winter. °SYMPTOMS   °· Symptoms of RMSF begin from 2 to 14 days after a tick bite. The most common early symptoms are fever, muscle aches and headache followed by nausea (feeling sick to your stomach) or vomiting.  °· The RMSF rash is typically delayed until 3 or more days after symptom onset, and eventually develops in 9 of 10 infected patients by the 5th day of illness.  °If the disease is not treated it can cause death. If you get a fever, headache, muscle aches, rash, nausea or vomiting within 2 weeks of a possible tick bite or exposure you should see your caregiver immediately. °PREVENTION °Ticks prefer to hide in shady, moist ground litter. They can often be found above the ground clinging to tall grass, brush, shrubs and low tree branches. They also inhabit lawns and gardens, especially at the edges of woodlands and around old stone walls. Within the areas where ticks generally live, no naturally vegetated area can be considered completely free of infected ticks. The best precaution against RMSF is to avoid contact with soil, leaf litter and vegetation as much as possible in tick infested areas. For those who enjoy gardening or walking in their yards, clear brush and mow tall grass around houses and at the edges of gardens. This may help reduce the tick population in the immediate area. Applications of chemical insecticides by a licensed professional in the spring (late May) and Fall (September) will also control ticks, especially in heavily infested areas. Treatment will never get rid of all the ticks. Getting rid of small animal populations that host ticks will also decrease the tick population. When working in   the garden, pruning shrubs, or handling soil and vegetation, wear light-colored protective clothing and gloves. Spot-check often to prevent ticks from reaching the skin. Ticks cannot jump or fly. They will not drop from an above-ground perch onto a passing animal. Once a tick gains access to human skin it climbs upward  until it reaches a more protected area. For example, the back of the knee, groin, navel, armpit, ears or nape of the neck. It then begins the slow process of embedding itself in the skin. °Campers, hikers, field workers, and others who spend time in wooded, brushy or tall grassy areas can avoid exposure to ticks by using the following precautions: °· Wear light-colored clothing with a tight weave to spot ticks more easily and prevent contact with the skin.  °· Wear long pants tucked into socks, long-sleeved shirts tucked into pants and enclosed shoes or boots along with insect repellent.  °· Spray clothes with insect repellent containing either DEET or Permethrin. Only DEET can be used on exposed skin. Follow the manufacturer's directions carefully.  °· Wear a hat and keep long hair pulled back.  °· Stay on cleared, well-worn trails whenever possible.  °· Spot-check yourself and others often for the presence of ticks on clothes. If you find one, there are likely to be others. Check thoroughly.  °· Remove clothes after leaving tick-infested areas. If possible, wash them to eliminate any unseen ticks. Check yourself, your children and any pets from head to toe for the presence of ticks.  °· Shower and shampoo.  °You can greatly reduce your chances of contracting RMSF if you remove attached ticks as soon as possible. Regular checks of the body, including all body sites covered by hair (head, armpits, genitals), allow removal of the tick before rickettsial transmission. To remove an attached tick, use a forceps or tweezers to detach the intact tick without leaving mouth parts in the skin. The tick bite wound should be cleansed after tick removal. °Remember the most common symptoms of RMSF are fever, muscle aches, headache and nausea or vomiting with a later onset of rash. If you get these symptoms after a tick bite and while living in an area where RMSF is found, RMSF should be suspected. If the disease is not treated,  it can cause death. See your caregiver immediately if you get these symptoms. Do this even if not aware of a tick bite. °Document Released: 08/08/2000 Document Revised: 04/15/2011 Document Reviewed: 03/31/2009 °ExitCare® Patient Information ©2012 [employer]. °

## 2011-10-25 NOTE — Telephone Encounter (Signed)
Caller: Tony Montoya/Patient; PCP: Madelin Headings.; CB#: [phone];  Call regarding Tick Bite on 10/22/11. Back is itchy and swollen and has red quarter sized area at bite site - onset 10/24/11. Last night it drained some clear fluid, Headache onset 10/24/11 and head still hurts today. Had one dizzy spell today and arms, hands and back are achy -10/25/11. Not sure if he has fever. Triage and Care advice per Bites and Stings Protocol and appnt scheduled for 1515- 10/25/11.

## 2011-10-26 ENCOUNTER — Encounter: Payer: Self-pay | Admitting: Family

## 2011-10-26 ENCOUNTER — Other Ambulatory Visit: Payer: Self-pay | Admitting: Family Medicine

## 2011-10-26 MED ORDER — PANTOPRAZOLE SODIUM 40 MG PO TBEC: 40.0000 mg | DELAYED_RELEASE_TABLET | Freq: Every day | ORAL | Status: DC

## 2011-10-26 NOTE — Telephone Encounter (Signed)
Sent to the pharmacy #[id number] with 0 refills.  Pt last seen on 09/15/10 but has pending appt for 10/29/11.

## 2011-10-29 ENCOUNTER — Encounter: Payer: Self-pay | Admitting: Internal Medicine

## 2011-10-29 ENCOUNTER — Ambulatory Visit (INDEPENDENT_AMBULATORY_CARE_PROVIDER_SITE_OTHER): Payer: 59 | Admitting: Internal Medicine

## 2011-10-29 VITALS — BP 120/80 | Temp 98.2°F | Wt 183.0 lb

## 2011-10-29 DIAGNOSIS — R51 Headache: Secondary | ICD-10-CM

## 2011-10-29 DIAGNOSIS — R6882 Decreased libido: Secondary | ICD-10-CM

## 2011-10-29 DIAGNOSIS — R5381 Other malaise: Secondary | ICD-10-CM

## 2011-10-29 DIAGNOSIS — R5383 Other fatigue: Secondary | ICD-10-CM

## 2011-10-29 DIAGNOSIS — Z87828 Personal history of other (healed) physical injury and trauma: Secondary | ICD-10-CM

## 2011-10-29 DIAGNOSIS — G8929 Other chronic pain: Secondary | ICD-10-CM
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
## 2011-11-02 ENCOUNTER — Other Ambulatory Visit (INDEPENDENT_AMBULATORY_CARE_PROVIDER_SITE_OTHER): Payer: 59

## 2011-11-02 DIAGNOSIS — R5383 Other fatigue: Secondary | ICD-10-CM

## 2011-11-02 DIAGNOSIS — G8929 Other chronic pain: Secondary | ICD-10-CM

## 2011-11-02 DIAGNOSIS — R6882 Decreased libido: Secondary | ICD-10-CM

## 2011-11-02 DIAGNOSIS — R51 Headache: Secondary | ICD-10-CM

## 2011-11-02 DIAGNOSIS — R5381 Other malaise: Secondary | ICD-10-CM

## 2011-11-02 LAB — BASIC METABOLIC PANEL
BUN: 21 mg/dL (ref 6–23)
CO2: 25 mEq/L (ref 19–32)
Chloride: 106 mEq/L (ref 96–112)
Creatinine, Ser: 0.8 mg/dL (ref 0.4–1.5)
Glucose, Bld: 120 mg/dL — ABNORMAL HIGH (ref 70–99)

## 2011-11-02 LAB — CBC WITH DIFFERENTIAL/PLATELET
Basophils Relative: 0.5 % (ref 0.0–3.0)
Eosinophils Absolute: 0.2 10*3/uL (ref 0.0–0.7)
HCT: 42.9 % (ref 39.0–52.0)
Lymphs Abs: 2.2 10*3/uL (ref 0.7–4.0)
MCHC: 33.4 g/dL (ref 30.0–36.0)
MCV: 98.6 fl (ref 78.0–100.0)
Monocytes Absolute: 0.6 10*3/uL (ref 0.1–1.0)
Neutro Abs: 3.5 10*3/uL (ref 1.4–7.7)
Neutrophils Relative %: 53.9 % (ref 43.0–77.0)
RBC: 4.35 Mil/uL (ref 4.22–5.81)

## 2011-11-02 LAB — HEPATIC FUNCTION PANEL
ALT: 22 U/L (ref 0–53)
AST: 23 U/L (ref 0–37)
Albumin: 3.6 g/dL (ref 3.5–5.2)
Total Protein: 6.4 g/dL (ref 6.0–8.3)

## 2011-11-02 LAB — TSH: TSH: 0.8 u[IU]/mL (ref 0.35–5.50)

## 2011-11-02 LAB — TESTOSTERONE: Testosterone: 441.77 ng/dL (ref 350.00–890.00)

## 2012-01-11 ENCOUNTER — Emergency Department (HOSPITAL_COMMUNITY)
Admission: EM | Admit: 2012-01-11 | Discharge: 2012-01-12 | Disposition: A | Discharge status: Home / Self Care | Payer: 59 | Attending: Emergency Medicine | Admitting: Emergency Medicine

## 2012-01-11 ENCOUNTER — Emergency Department (HOSPITAL_COMMUNITY): Payer: 59

## 2012-01-11 ENCOUNTER — Encounter (HOSPITAL_COMMUNITY): Payer: Self-pay | Admitting: Adult Health

## 2012-01-11 DIAGNOSIS — R05 Cough: Secondary | ICD-10-CM | POA: Insufficient documentation

## 2012-01-11 DIAGNOSIS — R197 Diarrhea, unspecified: Secondary | ICD-10-CM | POA: Insufficient documentation

## 2012-01-11 DIAGNOSIS — R059 Cough, unspecified: Secondary | ICD-10-CM | POA: Insufficient documentation

## 2012-01-11 DIAGNOSIS — R0602 Shortness of breath: Secondary | ICD-10-CM | POA: Insufficient documentation

## 2012-01-11 DIAGNOSIS — R5383 Other fatigue: Secondary | ICD-10-CM | POA: Insufficient documentation

## 2012-01-11 DIAGNOSIS — T675XXA Heat exhaustion, unspecified, initial encounter: Secondary | ICD-10-CM | POA: Insufficient documentation

## 2012-01-11 DIAGNOSIS — X30XXXA Exposure to excessive natural heat, initial encounter: Secondary | ICD-10-CM | POA: Insufficient documentation

## 2012-01-11 DIAGNOSIS — R5381 Other malaise: Secondary | ICD-10-CM | POA: Insufficient documentation

## 2012-01-11 DIAGNOSIS — R51 Headache: Secondary | ICD-10-CM | POA: Insufficient documentation

## 2012-01-11 LAB — CBC WITH DIFFERENTIAL/PLATELET
Basophils Absolute: 0 10*3/uL (ref 0.0–0.1)
Basophils Relative: 0 % (ref 0–1)
Eosinophils Absolute: 0.2 10*3/uL (ref 0.0–0.7)
Eosinophils Relative: 1 % (ref 0–5)
HCT: 48.6 % (ref 39.0–52.0)
MCH: 32.8 pg (ref 26.0–34.0)
MCHC: 34.6 g/dL (ref 30.0–36.0)
Monocytes Absolute: 0.7 10*3/uL (ref 0.1–1.0)
Neutro Abs: 10.4 10*3/uL — ABNORMAL HIGH (ref 1.7–7.7)
RDW: 12.7 % (ref 11.5–15.5)

## 2012-01-11 LAB — GLUCOSE, CAPILLARY: Glucose-Capillary: 112 mg/dL — ABNORMAL HIGH (ref 70–99)

## 2012-01-11 LAB — COMPREHENSIVE METABOLIC PANEL
AST: 26 U/L (ref 0–37)
Albumin: 4.4 g/dL (ref 3.5–5.2)
Calcium: 9.9 mg/dL (ref 8.4–10.5)
Chloride: 104 mEq/L (ref 96–112)
Creatinine, Ser: 0.95 mg/dL (ref 0.50–1.35)
Total Protein: 8.1 g/dL (ref 6.0–8.3)

## 2012-01-11 LAB — POCT I-STAT TROPONIN I: Troponin i, poc: 0 ng/mL (ref 0.00–0.08)

## 2012-01-11 MED ORDER — KETOROLAC TROMETHAMINE 30 MG/ML IJ SOLN
30.0000 mg | Freq: Once | INTRAMUSCULAR | Status: AC
  Administered 2012-01-11: 30 mg via INTRAVENOUS
  Filled 2012-01-11: qty 1

## 2012-01-11 MED ORDER — SODIUM CHLORIDE 0.9 % IV BOLUS (SEPSIS)
1000.0000 mL | Freq: Once | INTRAVENOUS | Status: AC
  Administered 2012-01-11: 1000 mL via INTRAVENOUS
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
## 2012-01-11 NOTE — ED Notes (Signed)
Pt states that he has been feeling weak in her upper body since sat. Pt family member states that he may have had a heat stroke, pt states that it is only his upper body, pt has equal grips and strength in hands. Pt denies HA, N/V. Pt states that he feels dry and that he doesn't know if he can urinate at this time due to feeling dry. Pt alert and oriented, able to follow commands and move extremities.

## 2012-01-11 NOTE — ED Notes (Signed)
Per registration, pt fell to sleep while she was registering pt.  VS wnl.  HR normal rhythm.  CBG not taken b/c was 180.  Pt ao x 4.  States he is sleepy.

## 2012-01-11 NOTE — ED Notes (Addendum)
Reports nausea and diarrhea yesterday associated with abdominal cramping, today woke up and felt okay at 1 pm today developed blurred vision and weakness of upper body, associated with bilateral arm numbness and SOB. No facial droop, no arm drift, speech clear, grip equal and weak.  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

## 2012-01-12 MED ORDER — SODIUM CHLORIDE 0.9 % IV BOLUS (SEPSIS)
1000.0000 mL | Freq: Once | INTRAVENOUS | Status: AC
  Administered 2012-01-12: 1000 mL via INTRAVENOUS

## 2012-01-12 NOTE — ED Notes (Signed)
Pt states he feels better. Pt finishing his fluids.

## 2012-01-16 ENCOUNTER — Other Ambulatory Visit: Payer: Self-pay | Admitting: Internal Medicine

## 2012-01-17 ENCOUNTER — Telehealth: Payer: Self-pay | Admitting: Internal Medicine

## 2012-01-17 NOTE — Telephone Encounter (Signed)
Caller: Hasnain/Patient; Patient Name: Tony Montoya, Tony Montoya; PCP: Berniece Andreas [facility]); Best Callback Phone Number: [phone]; Call regarding Protonix Refill.  Per EPIC, last filled in June 2013. Patient is currently asymtomatic. [facility] uses [facility], [place], [phone]. Please follow up with Patient.

## 2012-01-18 ENCOUNTER — Telehealth: Payer: Self-pay | Admitting: Internal Medicine

## 2012-01-18 NOTE — Telephone Encounter (Signed)
Sent to the pharmacy by e-scribe. 

## 2012-01-18 NOTE — Telephone Encounter (Signed)
Caller: Marius/Patient; Patient Name: Tony Montoya, Tony Montoya; PCP: Berniece [facility]); Best Callback Phone Number: [phone].  Patient calling in follow up regarding Protonix prescription.  States he called 01/17/12 to request new Rx, but pharmacy has not heard from office.  States he has been out of Rx for past week; states he apparently lost them while in [place], and has developed symptoms.  States the protonix is very helpful for his symptoms of reflux and nausea.  Per Epic, last Rx written 10/25/11.  Patient advised that it is a few days early for renewal of his 90-day Rx.  Triaged for abdominal pain; all questions negative.   Info to office per Epic for follow up/renewal of protonix Rx.   Uses [facility]. May reach patient at [phone].

## 2012-06-28 ENCOUNTER — Telehealth: Payer: Self-pay | Admitting: Internal Medicine

## 2012-06-28 NOTE — Telephone Encounter (Signed)
Patient Information:  Caller Name: Dayron  Phone: [phone]  Patient: Tony Montoya, Tony Montoya  Gender: Male  DOB: [birth year]  Age: 52 Years  PCP: Berniece Andreas (Family Practice)  Office Follow Up:  Does the office need to follow up with this patient?: No  Instructions For The Office: N/A   Symptoms  Reason For Call & Symptoms: Problems with Acid Reflux for 25 years, much worse for the last year.  Sick 10 times in the last month.  "Feels like guts are twisted inside" then gets knot in throat.  Sick twice per week for the last month.  Last night 2/18 ate 8-9 eggs cooked in canola oil about 10 pm.  Went to bed about 12:10am today 2/19.  Woke at 4am with severe upper abdominal pain just below rib cage, finally had to put finger down throat to throw up  Got some relief about 90 min later - says been living like that for several years.  The day afterward/today 2/19  he feels very weak, has sore throat.  Reviewed Health History In EMR: Yes  Reviewed Medications In EMR: Yes  Reviewed Allergies In EMR: Yes  Reviewed Surgeries / Procedures: Yes  Date of Onset of Symptoms: 06/28/2012  Treatments Tried: made self vomit Wife has given him Klonopin about 4 times in the last 4 years when pain was not relieved with vomiting and it eased some pain then knocked him out.  Treatments Tried Worked: Yes  Guideline(s) Used:  Abdominal Pain - Male  Abdominal Pain - Upper  Disposition Per Guideline:   See Within 2 Weeks in Office  Reason For Disposition Reached:   Abdominal pain is a chronic symptom (recurrent or ongoing AND lasting > 4 weeks)  Advice Given:  Fluids:   Sip clear fluids only (e.g., water, flat soft drinks, or half-strength fruit juice) until the pain is gone for 2 hours. Then slowly return to a regular diet.  Reducing Reflux Symptoms (GERD):  Eat smaller meals and avoid snacks for 2 hours before sleeping. Avoid the following foods, which tend to aggravate heartburn and stomach problems:  fatty/greasy foods, spicy foods, caffeinated beverages, mints, and chocolate.  Appointment Scheduled:  06/29/2012 09:45:00 Appointment Scheduled Provider:  Berniece Andreas [facility])

## 2012-06-29 ENCOUNTER — Ambulatory Visit (INDEPENDENT_AMBULATORY_CARE_PROVIDER_SITE_OTHER): Payer: 59 | Admitting: Internal Medicine

## 2012-06-29 ENCOUNTER — Encounter: Payer: Self-pay | Admitting: Internal Medicine

## 2012-06-29 VITALS — BP 114/74 | HR 80 | Temp 98.2°F | Wt 186.0 lb

## 2012-06-29 DIAGNOSIS — K219 Gastro-esophageal reflux disease without esophagitis: Secondary | ICD-10-CM

## 2012-06-29 DIAGNOSIS — K224 Dyskinesia of esophagus: Secondary | ICD-10-CM

## 2012-06-29 DIAGNOSIS — R109 Unspecified abdominal pain: Secondary | ICD-10-CM

## 2012-06-29 DIAGNOSIS — F411 Generalized anxiety disorder: Secondary | ICD-10-CM

## 2012-06-29 MED ORDER — CILIDINIUM-CHLORDIAZEPOXIDE 2.5-5 MG PO CAPS: 1.0000 | ORAL_CAPSULE | Freq: Three times a day (TID) | ORAL | Status: DC | PRN

## 2012-06-29 NOTE — Patient Instructions (Addendum)
Sounds like  gerd spasm.    Consider motility problem   . Aggravated by aniety stress  Will reviewed record and consider getting referral to  [facility] specialist.   Can try the antispasm  Medication in the short run.   But has a   Valium like side effect  So isn't the  Long term plan.    stress can make all of the above worse. Am still not convinced that gall bladder  Problem could be adding to this.  Will be in touch about referral . And further   Plans
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
## 2012-07-12 ENCOUNTER — Other Ambulatory Visit: Payer: Self-pay | Admitting: Internal Medicine

## 2012-07-27 ENCOUNTER — Ambulatory Visit (INDEPENDENT_AMBULATORY_CARE_PROVIDER_SITE_OTHER): Payer: 59 | Admitting: Internal Medicine

## 2012-07-27 VITALS — BP 94/60 | HR 81 | Temp 97.7°F | Wt 186.0 lb

## 2012-07-27 DIAGNOSIS — K219 Gastro-esophageal reflux disease without esophagitis: Secondary | ICD-10-CM

## 2012-07-27 DIAGNOSIS — R109 Unspecified abdominal pain: Secondary | ICD-10-CM

## 2012-07-27 DIAGNOSIS — K224 Dyskinesia of esophagus: Secondary | ICD-10-CM

## 2012-07-27 MED ORDER — CILIDINIUM-CHLORDIAZEPOXIDE 2.5-5 MG PO CAPS: 1.0000 | ORAL_CAPSULE | Freq: Three times a day (TID) | ORAL | Status: DC | PRN
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
## 2012-07-27 NOTE — Patient Instructions (Signed)
This still acts like some esophageal spasm  .  Taking   The librax  Too often   Can cause excess sedation     Take before your meal ( lunch ) .  To get best effect     . Can take only once or twice a day if controls   The sx.    Keep appt with [facility].   GI  . Hope fully other measures can be helpful.

## 2012-07-29 ENCOUNTER — Encounter: Payer: Self-pay | Admitting: Internal Medicine

## 2012-08-18 ENCOUNTER — Other Ambulatory Visit: Payer: Self-pay | Admitting: Neurology

## 2012-09-05 DIAGNOSIS — R1013 Epigastric pain: Secondary | ICD-10-CM | POA: Insufficient documentation

## 2012-09-14 ENCOUNTER — Ambulatory Visit (INDEPENDENT_AMBULATORY_CARE_PROVIDER_SITE_OTHER): Payer: 59 | Admitting: Family Medicine

## 2012-09-14 VITALS — BP 125/80 | HR 66 | Temp 98.2°F | Resp 16 | Ht 72.0 in | Wt 185.0 lb

## 2012-09-14 DIAGNOSIS — S0100XA Unspecified open wound of scalp, initial encounter: Secondary | ICD-10-CM

## 2012-09-14 DIAGNOSIS — S0990XA Unspecified injury of head, initial encounter: Secondary | ICD-10-CM
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
## 2012-09-14 NOTE — Patient Instructions (Signed)
Head Injury, Adult  You have had a head injury that does not appear serious at this time. A concussion is a state of changed mental ability, usually from a blow to the head. You should take clear liquids for the rest of the day and then resume your regular diet. You should not take sedatives or alcoholic beverages for as long as directed by your caregiver after discharge. After injuries such as yours, most problems occur within the first 24 hours.  SYMPTOMS  These minor symptoms may be experienced after discharge:   Memory difficulties.   Dizziness.   Headaches.   Double vision.   Hearing difficulties.   Depression.   Tiredness.   Weakness.   Difficulty with concentration.  If you experience any of these problems, you should not be alarmed. A concussion requires a few days for recovery. Many patients with head injuries frequently experience such symptoms. Usually, these problems disappear without medical care. If symptoms last for more than one day, notify your caregiver. See your caregiver sooner if symptoms are becoming worse rather than better.  HOME CARE INSTRUCTIONS    During the next 24 hours you must stay with someone who can watch you for the warning signs listed below.  Although it is unlikely that serious side effects will occur, you should be aware of signs and symptoms which may necessitate your return to this location. Side effects may occur up to 7  10 days following the injury. It is important for you to carefully monitor your condition and contact your caregiver or seek immediate medical attention if there is a change in your condition.  SEEK IMMEDIATE MEDICAL CARE IF:    There is confusion or drowsiness.   You can not awaken the injured person.   There is nausea (feeling sick to your stomach) or continued, forceful vomiting.   You notice dizziness or unsteadiness which is getting worse, or inability to walk.   You have convulsions or unconsciousness.   You experience severe,  persistent headaches not relieved by over-the-counter or prescription medicines for pain. (Do not take aspirin as this impairs clotting abilities). Take other pain medications only as directed.   You can not use arms or legs normally.   There is clear or bloody discharge from the nose or ears.  MAKE SURE YOU:    Understand these instructions.   Will watch your condition.   Will get help right away if you are not doing well or get worse.  Document Released: 04/26/2005 Document Revised: 07/19/2011 Document Reviewed: 03/14/2009  ExitCare Patient Information 2013 [employer].

## 2012-09-23 ENCOUNTER — Ambulatory Visit (INDEPENDENT_AMBULATORY_CARE_PROVIDER_SITE_OTHER): Payer: 59 | Admitting: Family Medicine

## 2012-09-23 DIAGNOSIS — Z5189 Encounter for other specified aftercare: Secondary | ICD-10-CM

## 2012-09-23 NOTE — Patient Instructions (Signed)
Keep wound clean, soap and water to area next 3-4 days as puncture areas from staples heal. Return to the clinic or go to the nearest emergency room if any of your symptoms worsen or new symptoms occur.

## 2012-09-23 NOTE — Progress Notes (Signed)
  Subjective:    Patient ID: Kc Summerson Howington, male    DOB: 08/12/1960, 52 y.o.   MRN: [id number]  HPI Keath Matera Hammen is a 52 y.o. male See 09/14/12 OV with Dr. Alwyn Ren - Laceration of scalp, Closed head injury.  Plan then for 2 day follow up.   Here for staple removal.   Headache few hours after last ov, but none since. No pain or discharge/redness in area.   Review of Systems  Skin: Negative for color change and rash.  Neurological: Negative for dizziness, syncope, weakness, light-headedness and headaches.       Objective:   Physical Exam  Vitals reviewed. Constitutional: He is oriented to person, place, and time. He appears well-developed and well-nourished.  HENT:  Head:    Neurological: He is alert and oriented to person, place, and time.  Nonfocal.           Assessment & Plan:  Mahlik Lenn Matty is a 52 y.o. male Open wound of scalp, without mention of complication, subsequent encounter  #7 staples removed without difficulty - healing well and no further ha or new sx's.  Wound care to staple sites discussed. rtc precautions.   Patient Instructions  Keep wound clean, soap and water to area next 3-4 days as puncture areas from staples heal. Return to the clinic or go to the nearest emergency room if any of your symptoms worsen or new symptoms occur.

## 2012-10-03 ENCOUNTER — Other Ambulatory Visit: Payer: Self-pay | Admitting: Internal Medicine

## 2012-10-04 ENCOUNTER — Telehealth: Payer: Self-pay | Admitting: Internal Medicine

## 2012-10-04 NOTE — Telephone Encounter (Signed)
Filled by [facility].

## 2012-10-04 NOTE — Telephone Encounter (Signed)
Pt requesting refill on his clidinium-chlordiazePOXIDE (LIBRAX) 2.5-5 MG per capsule [facility] in [place].

## 2012-10-05 ENCOUNTER — Ambulatory Visit: Payer: 59 | Admitting: Internal Medicine

## 2012-10-09 ENCOUNTER — Other Ambulatory Visit: Payer: Self-pay | Admitting: Internal Medicine

## 2012-10-24 ENCOUNTER — Ambulatory Visit: Payer: 59 | Admitting: Internal Medicine

## 2012-10-30 ENCOUNTER — Other Ambulatory Visit: Payer: Self-pay | Admitting: Internal Medicine

## 2012-11-07 ENCOUNTER — Ambulatory Visit: Payer: 59 | Admitting: Internal Medicine

## 2012-11-17 ENCOUNTER — Telehealth: Payer: Self-pay | Admitting: Internal Medicine

## 2012-11-17 NOTE — Telephone Encounter (Signed)
FYI pt stated librax is doing its job. Pt cancelled appt that was sch for 7-15

## 2012-11-17 NOTE — Telephone Encounter (Signed)
He was also suppose to get lab results.  What would you have me tell him about labs?

## 2012-11-21 ENCOUNTER — Ambulatory Visit: Payer: 59 | Admitting: Internal Medicine

## 2012-11-22 NOTE — Telephone Encounter (Signed)
iam not sure what labs  But he has been ot [facility] and  [facility]   From this i dont think he needs lab anytime soon  But would need OV before the end of the year.

## 2012-11-30 ENCOUNTER — Other Ambulatory Visit: Payer: Self-pay | Admitting: Family Medicine

## 2012-12-07 ENCOUNTER — Other Ambulatory Visit: Payer: Self-pay | Admitting: Internal Medicine

## 2012-12-11 NOTE — Telephone Encounter (Signed)
Pt is out of med

## 2013-01-03 ENCOUNTER — Encounter: Payer: Self-pay | Admitting: Physician Assistant

## 2013-01-03 ENCOUNTER — Ambulatory Visit (INDEPENDENT_AMBULATORY_CARE_PROVIDER_SITE_OTHER): Payer: 59 | Admitting: Physician Assistant

## 2013-01-03 VITALS — BP 106/66 | HR 64 | Temp 97.8°F | Resp 18 | Ht 71.0 in | Wt 179.0 lb

## 2013-01-03 DIAGNOSIS — R51 Headache: Secondary | ICD-10-CM

## 2013-01-03 DIAGNOSIS — R109 Unspecified abdominal pain: Secondary | ICD-10-CM

## 2013-01-03 DIAGNOSIS — K219 Gastro-esophageal reflux disease without esophagitis: Secondary | ICD-10-CM

## 2013-01-03 MED ORDER — PANTOPRAZOLE SODIUM 40 MG PO TBEC: DELAYED_RELEASE_TABLET | ORAL | Status: DC

## 2013-01-03 MED ORDER — GABAPENTIN 600 MG PO TABS: ORAL_TABLET | ORAL | Status: DC

## 2013-01-03 MED ORDER — CILIDINIUM-CHLORDIAZEPOXIDE 2.5-5 MG PO CAPS: ORAL_CAPSULE | ORAL | Status: DC
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
## 2013-01-17 ENCOUNTER — Ambulatory Visit (INDEPENDENT_AMBULATORY_CARE_PROVIDER_SITE_OTHER): Payer: 59 | Admitting: Physician Assistant

## 2013-01-17 ENCOUNTER — Encounter: Payer: Self-pay | Admitting: Physician Assistant

## 2013-01-17 VITALS — BP 94/58 | HR 52 | Temp 98.1°F | Resp 18 | Ht 71.0 in | Wt 174.0 lb

## 2013-01-17 DIAGNOSIS — K589 Irritable bowel syndrome without diarrhea: Secondary | ICD-10-CM

## 2013-01-17 DIAGNOSIS — Z0000 Encounter for general adult medical examination without abnormal findings: Secondary | ICD-10-CM

## 2013-01-17 DIAGNOSIS — R51 Headache: Secondary | ICD-10-CM

## 2013-01-17 DIAGNOSIS — R7309 Other abnormal glucose: Secondary | ICD-10-CM

## 2013-01-17 DIAGNOSIS — Z1211 Encounter for screening for malignant neoplasm of colon: Secondary | ICD-10-CM

## 2013-01-17 DIAGNOSIS — K219 Gastro-esophageal reflux disease without esophagitis: Secondary | ICD-10-CM

## 2013-01-17 DIAGNOSIS — E785 Hyperlipidemia, unspecified: Secondary | ICD-10-CM

## 2013-01-17 DIAGNOSIS — Z125 Encounter for screening for malignant neoplasm of prostate: Secondary | ICD-10-CM

## 2013-01-17 LAB — CBC WITH DIFFERENTIAL/PLATELET
Basophils Absolute: 0 10*3/uL (ref 0.0–0.1)
Basophils Relative: 1 % (ref 0–1)
Eosinophils Relative: 2 % (ref 0–5)
Lymphocytes Relative: 31 % (ref 12–46)
MCHC: 34.7 g/dL (ref 30.0–36.0)
Neutro Abs: 4.3 10*3/uL (ref 1.7–7.7)
Platelets: 207 10*3/uL (ref 150–400)
RDW: 12.9 % (ref 11.5–15.5)
WBC: 7.2 10*3/uL (ref 4.0–10.5)

## 2013-01-17 LAB — LIPID PANEL
Cholesterol: 215 mg/dL — ABNORMAL HIGH (ref 0–200)
LDL Cholesterol: 147 mg/dL — ABNORMAL HIGH (ref 0–99)
Total CHOL/HDL Ratio: 4.3 Ratio
Triglycerides: 88 mg/dL (ref <150)
VLDL: 18 mg/dL (ref 0–40)

## 2013-01-17 LAB — COMPLETE METABOLIC PANEL WITH GFR
ALT: 21 U/L (ref 0–53)
AST: 18 U/L (ref 0–37)
Alkaline Phosphatase: 55 U/L (ref 39–117)
Calcium: 9.8 mg/dL (ref 8.4–10.5)
Chloride: 106 mEq/L (ref 96–112)
Creat: 0.93 mg/dL (ref 0.50–1.35)
Potassium: 4.7 mEq/L (ref 3.5–5.3)

## 2013-01-17 LAB — PSA: PSA: 0.6 ng/mL (ref <=4.00)
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
## 2013-01-18 ENCOUNTER — Telehealth: Payer: Self-pay | Admitting: Family Medicine

## 2013-01-18 DIAGNOSIS — E785 Hyperlipidemia, unspecified: Secondary | ICD-10-CM

## 2013-01-18 MED ORDER — SIMVASTATIN 40 MG PO TABS: 40.0000 mg | ORAL_TABLET | Freq: Every day | ORAL | Status: DC

## 2013-01-18 NOTE — Telephone Encounter (Signed)
Spoke to patient about lab results.  Showing early sign of Diabetes and cholesterol is high.  Discussed watching sweets and carb's.  Also watch saturated fats.  Rx for simvastatin to pharmacy  6 week labs ordered and 3 mth F/U appt made

## 2013-01-18 NOTE — Telephone Encounter (Signed)
Message copied by Donne Anon on Thu Jan 18, 2013 12:19 PM ------      Message from: Allayne Butcher      Created: Thu Jan 18, 2013  8:05 AM       Patient was seen yesterday for complete physical exam.      His A1c is 6.0. This indicates that he has early diabetes. He does not require medications for this right now.       However given his diabetes his cholesterol needs to be much lower than it is. LDL is 147.      Start simvastatin 40 mg 1 by mouth each bedtime #30 with one refill.      Check fasting labs in 6 weeks: FLP/LFT.      All other labs were normal including CBC, CMET, PSA, TSH, vitamin D.      Have him schedule an office visit for 3 months from now so we can discuss carbohydrate diet and recheck A1c and follow up his cholesterol further. But I did want him to go ahead and do the fasting labs in 6 weeks as above also. ------

## 2013-01-24 ENCOUNTER — Telehealth: Payer: Self-pay | Admitting: Physician Assistant

## 2013-01-24 NOTE — Telephone Encounter (Signed)
Chart reviewed. He was just here for complete physical exam on 01/17/2013.  Based on the labs done then, I prescribe simvastatin 40 mg. Tell him to stop the simvastatin. Weight until all the muscle aches completely resolved. Then will start a different cholesterol medicine that goes to a different enzyme in the body and usually has less side effects. Send prescription for pravastatin 20 mg one by mouth each bedtime #30 with one refill. Order FLP LFT for 6 weeks.

## 2013-01-25 ENCOUNTER — Telehealth: Payer: Self-pay | Admitting: Physician Assistant

## 2013-01-25 NOTE — Telephone Encounter (Signed)
Pt states that he can not use any of the statins drugs. Is there anything else he can take?

## 2013-01-26 ENCOUNTER — Other Ambulatory Visit: Payer: Self-pay | Admitting: Physician Assistant

## 2013-01-26 MED ORDER — EZETIMIBE 10 MG PO TABS: 10.0000 mg | ORAL_TABLET | Freq: Every day | ORAL | Status: DC

## 2013-01-26 NOTE — Telephone Encounter (Signed)
Med refilled and pt is aware of message

## 2013-01-26 NOTE — Telephone Encounter (Signed)
Put Simvastatin on "allergy list"-myalgias He can take Zetia 10 mg one po QD  # 30 / 2 Can come fasting to his next OV in 3 mos and recheck lab while at OV

## 2013-01-30 ENCOUNTER — Telehealth: Payer: Self-pay | Admitting: Physician Assistant

## 2013-01-30 DIAGNOSIS — Z79899 Other long term (current) drug therapy: Secondary | ICD-10-CM

## 2013-01-30 DIAGNOSIS — E785 Hyperlipidemia, unspecified: Secondary | ICD-10-CM

## 2013-01-30 NOTE — Telephone Encounter (Signed)
Pt said that the Zetia that you put him on cost his $40 a month. He wants to know if there is anything that he can be on that is cheaper. He said his wife is on Crestor and she does not have to pay a copay, so he did not know if that would be an option.

## 2013-01-30 NOTE — Telephone Encounter (Signed)
Crestor 10mg  one po QD # 30 / one refill Fasting lab in 6 weeks

## 2013-01-30 NOTE — Telephone Encounter (Signed)
done

## 2013-01-31 ENCOUNTER — Encounter: Payer: Self-pay | Admitting: Family Medicine

## 2013-01-31 MED ORDER — ROSUVASTATIN CALCIUM 10 MG PO TABS: 10.0000 mg | ORAL_TABLET | Freq: Every day | ORAL | Status: DC

## 2013-01-31 NOTE — Telephone Encounter (Signed)
Pt notified today of med change.

## 2013-02-01 ENCOUNTER — Encounter: Payer: Self-pay | Admitting: Internal Medicine

## 2013-02-01 ENCOUNTER — Telehealth: Payer: Self-pay | Admitting: Physician Assistant

## 2013-02-01 NOTE — Telephone Encounter (Signed)
Crestor is going to cost him $45.00 a month. Do you know of anything cheaper he can get?

## 2013-02-02 MED ORDER — PRAVASTATIN SODIUM 40 MG PO TABS: 40.0000 mg | ORAL_TABLET | Freq: Every day | ORAL | Status: DC

## 2013-02-02 NOTE — Telephone Encounter (Signed)
New rx sent, labs already in system

## 2013-02-02 NOTE — Telephone Encounter (Signed)
Pravastatin 40mg  One Po QHS  # 30/ one refill Check F[place] FLP/LFT 6 weeks

## 2013-03-30 ENCOUNTER — Telehealth: Payer: Self-pay | Admitting: Gastroenterology

## 2013-03-30 NOTE — Telephone Encounter (Signed)
Pt requested to switch care from [facility] to [facility]. Explained procedure to pt on switching providers. Started to proceed with notes but pt is already scheduled for Colon with Dr. Rhea Belton on 04/16/13. This appointment was made back in September. Will leave appts as scheduled.

## 2013-03-30 NOTE — Telephone Encounter (Signed)
Okay if okay with Dr. Arlyce Dice

## 2013-04-02 ENCOUNTER — Ambulatory Visit (AMBULATORY_SURGERY_CENTER): Payer: 59 | Admitting: *Deleted

## 2013-04-02 VITALS — Ht 69.0 in | Wt 168.4 lb

## 2013-04-02 DIAGNOSIS — Z1211 Encounter for screening for malignant neoplasm of colon: Secondary | ICD-10-CM

## 2013-04-02 MED ORDER — MOVIPREP 100 G PO SOLR: ORAL | Status: DC

## 2013-04-02 NOTE — Progress Notes (Signed)
No allergies to eggs or soy. No problems with anesthesia.  

## 2013-04-11 ENCOUNTER — Encounter: Payer: Self-pay | Admitting: Internal Medicine

## 2013-04-16 ENCOUNTER — Ambulatory Visit (AMBULATORY_SURGERY_CENTER): Payer: 59 | Admitting: Internal Medicine

## 2013-04-16 ENCOUNTER — Encounter: Payer: Self-pay | Admitting: Internal Medicine

## 2013-04-16 VITALS — BP 105/66 | HR 45 | Temp 96.5°F | Resp 22 | Ht 69.0 in | Wt 168.0 lb

## 2013-04-16 DIAGNOSIS — D126 Benign neoplasm of colon, unspecified: Secondary | ICD-10-CM

## 2013-04-16 DIAGNOSIS — Z1211 Encounter for screening for malignant neoplasm of colon: Secondary | ICD-10-CM

## 2013-04-16 HISTORY — PX: COLONOSCOPY: SHX174

## 2013-04-16 MED ORDER — SODIUM CHLORIDE 0.9 % IV SOLN: 500.0000 mL | INTRAVENOUS | Status: DC

## 2013-04-16 NOTE — Patient Instructions (Signed)

## 2013-04-16 NOTE — Op Note (Signed)
[facility] [address] [place], [address]   COLONOSCOPY PROCEDURE REPORT  PATIENT: Tony, Montoya  MR#: [id number] BIRTHDATE: 04/13/1961 , 52  yrs. old GENDER: Male ENDOSCOPIST: Beverley Fiedler, MD REFERRED XB:JYNW Dionicio Stall, PA-C PROCEDURE DATE:  04/16/2013 PROCEDURE:   Colonoscopy with snare polypectomy and Colonoscopy with cold biopsy polypectomy First Screening Colonoscopy - Avg.  risk and is 50 yrs.  old or older Yes.  Prior Negative Screening - Now for repeat screening. N/A  History of Adenoma - Now for follow-up colonoscopy & has been > or = to 3 yrs.  N/A  Polyps Removed Today? Yes. ASA CLASS:   Class II INDICATIONS:average risk screening and first colonoscopy. MEDICATIONS: MAC sedation, administered by CRNA and propofol (Diprivan) 300mg  IV  DESCRIPTION OF PROCEDURE:   After the risks benefits and alternatives of the procedure were thoroughly explained, informed consent was obtained.  A digital rectal exam revealed no rectal mass.   The LB [id number]  endoscope was introduced through the anus and advanced to the cecum, which was identified by both the appendix and ileocecal valve. No adverse events experienced. The quality of the prep was Moviprep fair in the right colon (copious irrigation and lavage performed), good in the left.  The instrument was then slowly withdrawn as the colon was fully examined.   COLON FINDINGS: Three sessile polyps measuring 4-5 mm in size were found in the ascending colon, transverse colon, and descending colon.  Polypectomy was performed with cold forceps.  All resections were complete and all polyp tissue was completely retrieved.   Three sessile polyps ranging between 3-59mm in size were found in the rectosigmoid colon and rectum.  Polypectomy was performed using cold snare.  All resections were complete and all polyp tissue was completely retrieved.   There was mild scattered diverticulosis noted in the  descending colon and sigmoid colon. Retroflexed views revealed no abnormalities. The time to cecum=5 minutes 28 seconds.  Withdrawal time=18 minutes 07 seconds.  The scope was withdrawn and the procedure completed.  COMPLICATIONS: There were no complications.   ENDOSCOPIC IMPRESSION: 1.   Three sessile polyps measuring 4-5 mm in size were found in the ascending colon, transverse colon, and descending colon; Polypectomy was performed with cold forceps 2.   Three sessile polyps ranging between 3-68mm in size were found in the rectosigmoid colon and rectum; Polypectomy was performed using cold snare 3.   There was mild diverticulosis noted in the descending colon and sigmoid colon  RECOMMENDATIONS: 1.  Await pathology results 2.  High fiber diet 3.  Timing of repeat colonoscopy will be determined by pathology findings. 4.  You will receive a letter within 1-2 weeks with the results of your biopsy as well as final recommendations.  Please call my office if you have not received a letter after 3 weeks.   eSigned:  Beverley Fiedler, MD 04/16/2013 9:38 AM   cc: The Patient and Frazier Richards MD   PATIENT NAME:  Tony, Montoya MR#: [id number]

## 2013-04-16 NOTE — Progress Notes (Signed)
Called to room to assist during endoscopic procedure.  Patient ID and intended procedure confirmed with present staff. Received instructions for my participation in the procedure from the performing physician. ewm 

## 2013-04-16 NOTE — Progress Notes (Signed)
Per the pt his blood pressure runs on the low side...90/60's.  No complaints noted in the recovery room. Maw

## 2013-04-17 ENCOUNTER — Telehealth: Payer: Self-pay

## 2013-04-17 NOTE — Telephone Encounter (Signed)
  Follow up Call-  Call back number 04/16/2013  Post procedure Call Back phone  # [phone]  Permission to leave phone message Yes     Patient questions:  Do you have a fever, pain , or abdominal swelling? no Pain Score  0 *  Have you tolerated food without any problems? yes  Have you been able to return to your normal activities? yes  Do you have any questions about your discharge instructions: Diet   no Medications  no Follow up visit  no  Do you have questions or concerns about your Care? no  Actions: * If pain score is 4 or above: No action needed, pain <4.  No problems per the pt. Maw

## 2013-04-19 ENCOUNTER — Encounter: Payer: Self-pay | Admitting: Physician Assistant

## 2013-04-19 ENCOUNTER — Ambulatory Visit (INDEPENDENT_AMBULATORY_CARE_PROVIDER_SITE_OTHER): Payer: 59 | Admitting: Physician Assistant

## 2013-04-19 ENCOUNTER — Encounter: Payer: Self-pay | Admitting: Internal Medicine

## 2013-04-19 VITALS — BP 108/66 | HR 60 | Temp 97.7°F | Resp 18 | Wt 169.0 lb

## 2013-04-19 DIAGNOSIS — R7309 Other abnormal glucose: Secondary | ICD-10-CM

## 2013-04-19 DIAGNOSIS — R739 Hyperglycemia, unspecified: Secondary | ICD-10-CM | POA: Insufficient documentation

## 2013-04-19 DIAGNOSIS — E785 Hyperlipidemia, unspecified: Secondary | ICD-10-CM

## 2013-04-19 HISTORY — DX: Hyperglycemia, unspecified: R73.9

## 2013-04-19 LAB — HEMOGLOBIN A1C
Hgb A1c MFr Bld: 5.6 % (ref <5.7)
Mean Plasma Glucose: 114 mg/dL (ref <117)

## 2013-04-19 LAB — HEPATIC FUNCTION PANEL
ALT: 26 U/L (ref 0–53)
Albumin: 4.1 g/dL (ref 3.5–5.2)
Bilirubin, Direct: 0.1 mg/dL (ref 0.0–0.3)
Indirect Bilirubin: 0.4 mg/dL (ref 0.0–0.9)
Total Protein: 6.6 g/dL (ref 6.0–8.3)

## 2013-04-19 LAB — LIPID PANEL
Cholesterol: 193 mg/dL (ref 0–200)
LDL Cholesterol: 116 mg/dL — ABNORMAL HIGH (ref 0–99)
Total CHOL/HDL Ratio: 3.1 Ratio
VLDL: 15 mg/dL (ref 0–40)
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
## 2013-04-30 ENCOUNTER — Encounter: Payer: Self-pay | Admitting: Family Medicine

## 2013-05-08 ENCOUNTER — Telehealth: Payer: Self-pay | Admitting: Family Medicine

## 2013-05-08 MED ORDER — PRAVASTATIN SODIUM 40 MG PO TABS: 40.0000 mg | ORAL_TABLET | Freq: Every day | ORAL | Status: DC

## 2013-05-08 NOTE — Telephone Encounter (Signed)
Medication refilled per protocol. 

## 2013-07-02 ENCOUNTER — Telehealth: Payer: Self-pay | Admitting: Family Medicine

## 2013-07-02 DIAGNOSIS — K219 Gastro-esophageal reflux disease without esophagitis: Secondary | ICD-10-CM

## 2013-07-02 DIAGNOSIS — R109 Unspecified abdominal pain: Secondary | ICD-10-CM

## 2013-07-02 MED ORDER — PANTOPRAZOLE SODIUM 40 MG PO TBEC: DELAYED_RELEASE_TABLET | ORAL | Status: DC

## 2013-07-02 MED ORDER — CILIDINIUM-CHLORDIAZEPOXIDE 2.5-5 MG PO CAPS: ORAL_CAPSULE | ORAL | Status: DC

## 2013-07-02 NOTE — Telephone Encounter (Signed)
Okay to fill to last 6 months after last visit

## 2013-07-02 NOTE — Telephone Encounter (Signed)
Medication refilled per protocol. 

## 2013-07-02 NOTE — Telephone Encounter (Signed)
Last RF Librax 01/03/13 #60 + 5.  Last OV 04/19/13  OK refill?

## 2013-10-18 ENCOUNTER — Ambulatory Visit: Payer: 59 | Admitting: Physician Assistant

## 2013-11-22 ENCOUNTER — Other Ambulatory Visit: Payer: Self-pay | Admitting: *Deleted

## 2013-11-22 ENCOUNTER — Encounter: Payer: Self-pay | Admitting: *Deleted

## 2013-11-22 MED ORDER — PRAVASTATIN SODIUM 40 MG PO TABS: 40.0000 mg | ORAL_TABLET | Freq: Every day | ORAL | Status: DC

## 2013-11-22 NOTE — Telephone Encounter (Signed)
Medication filled x1 with no refills.   Requires office visit before any further refills can be given.   Letter sent.  

## 2013-12-10 ENCOUNTER — Encounter: Payer: Self-pay | Admitting: Physician Assistant

## 2013-12-10 ENCOUNTER — Ambulatory Visit (INDEPENDENT_AMBULATORY_CARE_PROVIDER_SITE_OTHER): Payer: 59 | Admitting: Physician Assistant

## 2013-12-10 VITALS — BP 100/80 | HR 68 | Temp 97.7°F | Resp 18 | Ht 71.0 in | Wt 175.0 lb

## 2013-12-10 DIAGNOSIS — K219 Gastro-esophageal reflux disease without esophagitis: Secondary | ICD-10-CM

## 2013-12-10 DIAGNOSIS — E785 Hyperlipidemia, unspecified: Secondary | ICD-10-CM

## 2013-12-10 DIAGNOSIS — R739 Hyperglycemia, unspecified: Secondary | ICD-10-CM

## 2013-12-10 DIAGNOSIS — R7309 Other abnormal glucose: Secondary | ICD-10-CM

## 2013-12-10 DIAGNOSIS — K589 Irritable bowel syndrome without diarrhea: Secondary | ICD-10-CM
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
## 2013-12-12 ENCOUNTER — Other Ambulatory Visit: Payer: 59

## 2013-12-12 LAB — COMPLETE METABOLIC PANEL WITH GFR
ALBUMIN: 4.2 g/dL (ref 3.5–5.2)
ALT: 27 U/L (ref 0–53)
AST: 20 U/L (ref 0–37)
Alkaline Phosphatase: 54 U/L (ref 39–117)
BUN: 20 mg/dL (ref 6–23)
CALCIUM: 9.6 mg/dL (ref 8.4–10.5)
CHLORIDE: 110 meq/L (ref 96–112)
CO2: 27 mEq/L (ref 19–32)
Creat: 0.79 mg/dL (ref 0.50–1.35)
GFR, Est African American: >89 mL/min
GFR, Est Non African American: >89 mL/min
Glucose, Bld: 103 mg/dL — ABNORMAL HIGH (ref 70–99)
Potassium: 4.5 mEq/L (ref 3.5–5.3)
SODIUM: 142 meq/L (ref 135–145)
Total Bilirubin: 0.5 mg/dL (ref 0.2–1.2)
Total Protein: 6.7 g/dL (ref 6.0–8.3)

## 2013-12-12 LAB — LIPID PANEL
CHOLESTEROL: 174 mg/dL (ref 0–200)
HDL: 64 mg/dL (ref >39)
LDL CALC: 99 mg/dL (ref 0–99)
Total CHOL/HDL Ratio: 2.7 Ratio
Triglycerides: 57 mg/dL (ref <150)
VLDL: 11 mg/dL (ref 0–40)

## 2013-12-12 LAB — HEMOGLOBIN A1C
Hgb A1c MFr Bld: 6 % — ABNORMAL HIGH (ref <5.7)
Mean Plasma Glucose: 126 mg/dL — ABNORMAL HIGH (ref <117)

## 2013-12-13 ENCOUNTER — Encounter: Payer: Self-pay | Admitting: *Deleted

## 2013-12-26 ENCOUNTER — Other Ambulatory Visit: Payer: Self-pay | Admitting: Physician Assistant

## 2013-12-27 ENCOUNTER — Encounter: Payer: Self-pay | Admitting: Family Medicine

## 2013-12-27 ENCOUNTER — Ambulatory Visit (INDEPENDENT_AMBULATORY_CARE_PROVIDER_SITE_OTHER): Payer: 59 | Admitting: Family Medicine

## 2013-12-27 VITALS — BP 100/70 | HR 78 | Temp 98.2°F | Resp 16 | Ht 71.0 in | Wt 171.0 lb

## 2013-12-27 DIAGNOSIS — H8110 Benign paroxysmal vertigo, unspecified ear: Secondary | ICD-10-CM

## 2013-12-27 DIAGNOSIS — H8113 Benign paroxysmal vertigo, bilateral: Secondary | ICD-10-CM

## 2013-12-27 MED ORDER — MECLIZINE HCL 25 MG PO TABS: 25.0000 mg | ORAL_TABLET | Freq: Three times a day (TID) | ORAL | Status: DC | PRN

## 2013-12-28 ENCOUNTER — Encounter: Payer: Self-pay | Admitting: Family Medicine
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
## 2014-01-22 ENCOUNTER — Other Ambulatory Visit: Payer: Self-pay | Admitting: Physician Assistant

## 2014-02-08 ENCOUNTER — Telehealth: Payer: Self-pay | Admitting: Family Medicine

## 2014-02-08 MED ORDER — ALPRAZOLAM 0.5 MG PO TBDP: 0.5000 mg | ORAL_TABLET | ORAL | Status: DC

## 2014-02-08 NOTE — Telephone Encounter (Signed)
Flying out tomorrow morning and returning following Saturday.  Wants something to calm him for the flights.  Please call to [facility].

## 2014-02-08 NOTE — Telephone Encounter (Signed)
RX called in and patient is aware

## 2014-02-08 NOTE — Telephone Encounter (Signed)
Xanax 0.5 mg po 30 min prior to flying.  (10)

## 2014-02-26 ENCOUNTER — Other Ambulatory Visit: Payer: Self-pay | Admitting: Physician Assistant

## 2014-02-26 NOTE — Telephone Encounter (Signed)
Medication refilled per protocol. 

## 2014-03-22 ENCOUNTER — Other Ambulatory Visit: Payer: Self-pay | Admitting: Physician Assistant

## 2014-03-22 NOTE — Telephone Encounter (Signed)
Medication refilled per protocol. 

## 2014-06-12 ENCOUNTER — Ambulatory Visit (INDEPENDENT_AMBULATORY_CARE_PROVIDER_SITE_OTHER): Payer: 59 | Admitting: Physician Assistant

## 2014-06-12 ENCOUNTER — Encounter: Payer: Self-pay | Admitting: Physician Assistant

## 2014-06-12 VITALS — BP 86/62 | HR 56 | Temp 97.8°F | Resp 18 | Wt 168.0 lb

## 2014-06-12 DIAGNOSIS — F411 Generalized anxiety disorder: Secondary | ICD-10-CM

## 2014-06-12 DIAGNOSIS — E785 Hyperlipidemia, unspecified: Secondary | ICD-10-CM

## 2014-06-12 DIAGNOSIS — R739 Hyperglycemia, unspecified: Secondary | ICD-10-CM

## 2014-06-12 DIAGNOSIS — K219 Gastro-esophageal reflux disease without esophagitis: Secondary | ICD-10-CM

## 2014-06-12 DIAGNOSIS — Z125 Encounter for screening for malignant neoplasm of prostate: Secondary | ICD-10-CM

## 2014-06-12 DIAGNOSIS — M1611 Unilateral primary osteoarthritis, right hip: Secondary | ICD-10-CM

## 2014-06-12 DIAGNOSIS — K589 Irritable bowel syndrome without diarrhea: Secondary | ICD-10-CM

## 2014-06-12 LAB — COMPLETE METABOLIC PANEL WITH GFR
ALT: 23 U/L (ref 0–53)
AST: 18 U/L (ref 0–37)
Albumin: 3.8 g/dL (ref 3.5–5.2)
Alkaline Phosphatase: 76 U/L (ref 39–117)
BUN: 19 mg/dL (ref 6–23)
CHLORIDE: 106 meq/L (ref 96–112)
CO2: 24 mEq/L (ref 19–32)
Calcium: 9.4 mg/dL (ref 8.4–10.5)
Creat: 0.85 mg/dL (ref 0.50–1.35)
GFR, Est African American: >89 mL/min
GFR, Est Non African American: >89 mL/min
GLUCOSE: 78 mg/dL (ref 70–99)
POTASSIUM: 5 meq/L (ref 3.5–5.3)
SODIUM: 140 meq/L (ref 135–145)
Total Bilirubin: 0.4 mg/dL (ref 0.2–1.2)
Total Protein: 6.7 g/dL (ref 6.0–8.3)

## 2014-06-12 LAB — LIPID PANEL
Cholesterol: 178 mg/dL (ref 0–200)
HDL: 48 mg/dL (ref >39)
LDL CALC: 107 mg/dL — AB (ref 0–99)
TRIGLYCERIDES: 117 mg/dL (ref <150)
Total CHOL/HDL Ratio: 3.7 Ratio
VLDL: 23 mg/dL (ref 0–40)

## 2014-06-12 LAB — HEMOGLOBIN A1C
Hgb A1c MFr Bld: 5.9 % — ABNORMAL HIGH
Mean Plasma Glucose: 123 mg/dL — ABNORMAL HIGH

## 2014-06-12 MED ORDER — NAPROXEN 500 MG PO TABS: 500.0000 mg | ORAL_TABLET | Freq: Two times a day (BID) | ORAL | Status: DC

## 2014-06-12 MED ORDER — PANTOPRAZOLE SODIUM 40 MG PO TBEC
40.0000 mg | DELAYED_RELEASE_TABLET | Freq: Two times a day (BID) | ORAL | Status: DC
Start: 2014-06-12 — End: 2015-04-24
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
## 2014-06-13 ENCOUNTER — Encounter: Payer: Self-pay | Admitting: Physician Assistant

## 2014-06-13 LAB — PSA: PSA: 0.47 ng/mL (ref <=4.00)

## 2014-07-22 ENCOUNTER — Encounter: Payer: Self-pay | Admitting: Physician Assistant

## 2014-08-29 ENCOUNTER — Encounter: Payer: Self-pay | Admitting: Physician Assistant

## 2014-11-13 ENCOUNTER — Other Ambulatory Visit: Payer: Self-pay | Admitting: Physician Assistant

## 2014-11-13 ENCOUNTER — Other Ambulatory Visit: Payer: Self-pay | Admitting: Family Medicine

## 2014-11-13 MED ORDER — PRAVASTATIN SODIUM 40 MG PO TABS: 40.0000 mg | ORAL_TABLET | Freq: Every day | ORAL | Status: DC

## 2014-11-13 NOTE — Telephone Encounter (Signed)
Medication refilled per protocol. 

## 2014-11-19 ENCOUNTER — Encounter: Payer: Self-pay | Admitting: Gastroenterology

## 2014-11-28 ENCOUNTER — Other Ambulatory Visit: Payer: Self-pay | Admitting: Family Medicine

## 2014-11-28 NOTE — Telephone Encounter (Signed)
LRF 02/08/14 LOV 06/12/14  OK refill?

## 2014-12-02 NOTE — Telephone Encounter (Signed)
Approved. # [id number] 1

## 2014-12-02 NOTE — Telephone Encounter (Signed)
rx called in

## 2014-12-15 ENCOUNTER — Ambulatory Visit (INDEPENDENT_AMBULATORY_CARE_PROVIDER_SITE_OTHER): Payer: 59 | Admitting: Physician Assistant

## 2014-12-15 VITALS — BP 120/70 | HR 74 | Temp 98.1°F | Resp 18 | Ht 70.0 in | Wt 162.0 lb

## 2014-12-15 DIAGNOSIS — M79621 Pain in right upper arm: Secondary | ICD-10-CM

## 2014-12-15 DIAGNOSIS — M25511 Pain in right shoulder: Secondary | ICD-10-CM

## 2014-12-15 DIAGNOSIS — L0291 Cutaneous abscess, unspecified: Secondary | ICD-10-CM | POA: Diagnosis not present

## 2014-12-15 MED ORDER — DOXYCYCLINE HYCLATE 100 MG PO CAPS: 100.0000 mg | ORAL_CAPSULE | Freq: Two times a day (BID) | ORAL | Status: DC

## 2014-12-15 MED ORDER — NAPROXEN 500 MG PO TABS: 500.0000 mg | ORAL_TABLET | Freq: Two times a day (BID) | ORAL | Status: DC
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
## 2014-12-17 ENCOUNTER — Ambulatory Visit (INDEPENDENT_AMBULATORY_CARE_PROVIDER_SITE_OTHER): Payer: 59 | Admitting: Physician Assistant

## 2014-12-17 VITALS — BP 102/62 | HR 60 | Temp 98.1°F | Resp 16 | Ht 69.0 in | Wt 163.2 lb

## 2014-12-17 DIAGNOSIS — M25511 Pain in right shoulder: Secondary | ICD-10-CM

## 2014-12-17 DIAGNOSIS — L0291 Cutaneous abscess, unspecified: Secondary | ICD-10-CM

## 2014-12-17 DIAGNOSIS — M79621 Pain in right upper arm: Secondary | ICD-10-CM
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
## 2014-12-19 LAB — WOUND CULTURE: Gram Stain: NONE SEEN

## 2014-12-21 NOTE — Progress Notes (Signed)
  Medical screening examination/treatment/procedure(s) were performed by non-physician practitioner and as supervising physician I was immediately available for consultation/collaboration.     

## 2015-01-10 ENCOUNTER — Other Ambulatory Visit: Payer: Self-pay | Admitting: Physician Assistant

## 2015-01-10 NOTE — Telephone Encounter (Signed)
Medication refilled per protocol. 

## 2015-01-14 NOTE — Telephone Encounter (Signed)
Librax was already called in

## 2015-02-17 ENCOUNTER — Other Ambulatory Visit: Payer: Self-pay | Admitting: Physician Assistant

## 2015-02-17 NOTE — Telephone Encounter (Signed)
Medication refilled per protocol. 

## 2015-04-22 ENCOUNTER — Other Ambulatory Visit: Payer: Self-pay | Admitting: Physician Assistant

## 2015-04-23 ENCOUNTER — Other Ambulatory Visit: Payer: Self-pay | Admitting: Physician Assistant

## 2015-04-23 ENCOUNTER — Encounter: Payer: Self-pay | Admitting: Family Medicine

## 2015-04-23 NOTE — Telephone Encounter (Signed)
Overdue for office visit and fasting labs. Have him schedule a visit for early morning. Then if he needs a refill to hold him over until that appointment, will refill if he schedules appointment.

## 2015-04-23 NOTE — Telephone Encounter (Signed)
Medication refilled per protocol. 

## 2015-04-23 NOTE — Telephone Encounter (Signed)
#  [phone] called in.  Pt has appt tomorrow

## 2015-04-23 NOTE — Telephone Encounter (Signed)
Librax denied.  Pt NTBS.  Letter to patient to schedule appt

## 2015-04-24 ENCOUNTER — Ambulatory Visit (INDEPENDENT_AMBULATORY_CARE_PROVIDER_SITE_OTHER): Payer: 59 | Admitting: Physician Assistant

## 2015-04-24 ENCOUNTER — Ambulatory Visit: Payer: 59 | Admitting: Physician Assistant

## 2015-04-24 ENCOUNTER — Encounter: Payer: Self-pay | Admitting: Physician Assistant

## 2015-04-24 VITALS — BP 86/50 | HR 78 | Temp 97.9°F | Resp 18 | Wt 172.0 lb

## 2015-04-24 DIAGNOSIS — K219 Gastro-esophageal reflux disease without esophagitis: Secondary | ICD-10-CM

## 2015-04-24 DIAGNOSIS — R739 Hyperglycemia, unspecified: Secondary | ICD-10-CM

## 2015-04-24 DIAGNOSIS — E785 Hyperlipidemia, unspecified: Secondary | ICD-10-CM | POA: Diagnosis not present

## 2015-04-24 DIAGNOSIS — M25511 Pain in right shoulder: Secondary | ICD-10-CM | POA: Diagnosis not present

## 2015-04-24 DIAGNOSIS — K589 Irritable bowel syndrome without diarrhea: Secondary | ICD-10-CM

## 2015-04-24 LAB — COMPLETE METABOLIC PANEL WITHOUT GFR
ALT: 18 U/L (ref 9–46)
AST: 17 U/L (ref 10–35)
Albumin: 4 g/dL (ref 3.6–5.1)
Alkaline Phosphatase: 57 U/L (ref 40–115)
BUN: 18 mg/dL (ref 7–25)
CO2: 23 mmol/L (ref 20–31)
Calcium: 9.1 mg/dL (ref 8.6–10.3)
Chloride: 105 mmol/L (ref 98–110)
Creat: 0.83 mg/dL (ref 0.70–1.33)
GFR, Est African American: >89 mL/min
GFR, Est Non African American: >89 mL/min
Glucose, Bld: 105 mg/dL — ABNORMAL HIGH (ref 70–99)
Potassium: 4.1 mmol/L (ref 3.5–5.3)
Sodium: 138 mmol/L (ref 135–146)
Total Bilirubin: 0.3 mg/dL (ref 0.2–1.2)
Total Protein: 6.5 g/dL (ref 6.1–8.1)

## 2015-04-24 LAB — LIPID PANEL
Cholesterol: 180 mg/dL (ref 125–200)
HDL: 47 mg/dL
LDL Cholesterol: 110 mg/dL
Total CHOL/HDL Ratio: 3.8 ratio
Triglycerides: 117 mg/dL
VLDL: 23 mg/dL

## 2015-04-24 MED ORDER — PANTOPRAZOLE SODIUM 40 MG PO TBEC: 40.0000 mg | DELAYED_RELEASE_TABLET | Freq: Two times a day (BID) | ORAL | Status: DC

## 2015-04-24 MED ORDER — PRAVASTATIN SODIUM 40 MG PO TABS: 40.0000 mg | ORAL_TABLET | Freq: Every day | ORAL | Status: DC

## 2015-04-24 MED ORDER — CILIDINIUM-CHLORDIAZEPOXIDE 2.5-5 MG PO CAPS: ORAL_CAPSULE | ORAL | Status: DC

## 2015-04-24 MED ORDER — MELOXICAM 7.5 MG PO TABS: 7.5000 mg | ORAL_TABLET | Freq: Every day | ORAL | Status: DC
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
## 2015-04-25 LAB — HEMOGLOBIN A1C
HEMOGLOBIN A1C: 5.8 % — AB (ref <5.7)
Mean Plasma Glucose: 120 mg/dL — ABNORMAL HIGH (ref <117)

## 2015-04-29 ENCOUNTER — Encounter: Payer: Self-pay | Admitting: Family Medicine

## 2015-06-30 ENCOUNTER — Other Ambulatory Visit: Payer: Self-pay | Admitting: Physician Assistant

## 2015-06-30 NOTE — Telephone Encounter (Signed)
Medication refilled per protocol. 

## 2015-08-31 ENCOUNTER — Encounter (HOSPITAL_COMMUNITY): Payer: Self-pay | Admitting: Emergency Medicine

## 2015-08-31 ENCOUNTER — Ambulatory Visit (HOSPITAL_COMMUNITY)
Admission: EM | Admit: 2015-08-31 | Discharge: 2015-08-31 | Disposition: A | Discharge status: Home / Self Care | Payer: 59 | Attending: Family Medicine | Admitting: Family Medicine

## 2015-08-31 DIAGNOSIS — W57XXXA Bitten or stung by nonvenomous insect and other nonvenomous arthropods, initial encounter: Secondary | ICD-10-CM | POA: Diagnosis not present

## 2015-08-31 DIAGNOSIS — T148 Other injury of unspecified body region: Secondary | ICD-10-CM | POA: Diagnosis not present

## 2015-08-31 DIAGNOSIS — G44209 Tension-type headache, unspecified, not intractable: Secondary | ICD-10-CM | POA: Diagnosis not present
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
## 2015-08-31 NOTE — ED Notes (Signed)
Reports he removed a tick on his RLE and since then he has been having a constant HA A&O x4.. No acute distress.

## 2015-08-31 NOTE — Discharge Instructions (Signed)
Tension Headache or Muscle contraction headache. A tension headache is a feeling of pain, pressure, or aching that is often felt over the front and sides of the head. The pain can be dull, or it can feel tight (constricting). Tension headaches are not normally associated with nausea or vomiting, and they do not get worse with physical activity. Tension headaches can last from 30 minutes to several days. This is the most common type of headache. CAUSES The exact cause of this condition is not known. Tension headaches often begin after stress, anxiety, or depression. Other triggers may include:  Alcohol.  Too much caffeine, or caffeine withdrawal.  Respiratory infections, such as colds, flu, or sinus infections.  Dental problems or teeth clenching.  Fatigue.  Holding your head and neck in the same position for a long period of time, such as while using a computer.  Smoking. SYMPTOMS Symptoms of this condition include:  A feeling of pressure around the head.  Dull, aching head pain.  Pain felt over the front and sides of the head.  Tenderness in the muscles of the head, neck, and shoulders. DIAGNOSIS This condition may be diagnosed based on your symptoms and a physical exam. Tests may be done, such as a CT scan or an MRI of your head. These tests may be done if your symptoms are severe or unusual. TREATMENT This condition may be treated with lifestyle changes and medicines to help relieve symptoms. HOME CARE INSTRUCTIONS Managing Pain 1. Take over-the-counter and prescription medicines only as told by your health care provider. 2. Lie down in a dark, quiet room when you have a headache. 3. If directed, apply ice to the head and neck area: 1. Put ice in a plastic bag. 2. Place a towel between your skin and the bag. 3. Leave the ice on for 20 minutes, 2-3 times per day. 4. Use a heating pad or a hot shower to apply heat to the head and neck area as told by your health care  provider. Eating and Drinking  Eat meals on a regular schedule.  Limit alcohol use.  Decrease your caffeine intake, or stop using caffeine. General Instructions  Keep all follow-up visits as told by your health care provider. This is important.  Keep a headache journal to help find out what may trigger your headaches. For example, write down:  What you eat and drink.  How much sleep you get.  Any change to your diet or medicines.  Try massage or other relaxation techniques.  Limit stress.  Sit up straight, and avoid tensing your muscles.  Do not use tobacco products, including cigarettes, chewing tobacco, or e-cigarettes. If you need help quitting, ask your health care provider.  Exercise regularly as told by your health care provider.  Get 7-9 hours of sleep, or the amount recommended by your health care provider. SEEK MEDICAL CARE IF:  Your symptoms are not helped by medicine.  You have a headache that is different from what you normally experience.  You have nausea or you vomit.  You have a fever. SEEK IMMEDIATE MEDICAL CARE IF:  Your headache becomes severe.  You have repeated vomiting.  You have a stiff neck.  You have a loss of vision.  You have problems with speech.  You have pain in your eye or ear.  You have muscular weakness or loss of muscle control.  You lose your balance or you have trouble walking.  You feel faint or you pass out.  You  have confusion.   This information is not intended to replace advice given to you by your health care provider. Make sure you discuss any questions you have with your health care provider.   Document Released: 04/26/2005 Document Revised: 01/15/2015 Document Reviewed: 08/19/2014 Elsevier Interactive Patient Education 2016 Elsevier Inc.  Tick Bite Information Apply benadryl cream and hydrocortisone cream 1% over the area of tick bite. Cold compresses. Ticks are insects that attach themselves to the  skin and draw blood for food. There are various types of ticks. Common types include wood ticks and deer ticks. Most ticks live in shrubs and grassy areas. Ticks can climb onto your body when you make contact with leaves or grass where the tick is waiting. The most common places on the body for ticks to attach themselves are the scalp, neck, armpits, waist, and groin. Most tick bites are harmless, but sometimes ticks carry germs that cause diseases. These germs can be spread to a person during the tick's feeding process. The chance of a disease spreading through a tick bite depends on:   The type of tick.  Time of year.   How long the tick is attached.   Geographic location.  HOW CAN YOU PREVENT TICK BITES? Take these steps to help prevent tick bites when you are outdoors:  Wear protective clothing. Long sleeves and long pants are best.   Wear white clothes so you can see ticks more easily.  Tuck your pant legs into your socks.   If walking on a trail, stay in the middle of the trail to avoid brushing against bushes.  Avoid walking through areas with long grass.  Put insect repellent on all exposed skin and along boot tops, pant legs, and sleeve cuffs.   Check clothing, hair, and skin repeatedly and before going inside.   Brush off any ticks that are not attached.  Take a shower or bath as soon as possible after being outdoors.  WHAT IS THE PROPER WAY TO REMOVE A TICK? Ticks should be removed as soon as possible to help prevent diseases caused by tick bites. 5. If latex gloves are available, put them on before trying to remove a tick.  6. Using fine-point tweezers, grasp the tick as close to the skin as possible. You may also use curved forceps or a tick removal tool. Grasp the tick as close to its head as possible. Avoid grasping the tick on its body. 7. Pull gently with steady upward pressure until the tick lets go. Do not twist the tick or jerk it suddenly. This may  break off the tick's head or mouth parts. 8. Do not squeeze or crush the tick's body. This could force disease-carrying fluids from the tick into your body.  9. After the tick is removed, wash the bite area and your hands with soap and water or other disinfectant such as alcohol. 10. Apply a small amount of antiseptic cream or ointment to the bite site.  11. Wash and disinfect any instruments that were used.  Do not try to remove a tick by applying a hot match, petroleum jelly, or fingernail polish to the tick. These methods do not work and may increase the chances of disease being spread from the tick bite.  WHEN SHOULD YOU SEEK MEDICAL CARE? Contact your health care provider if you are unable to remove a tick from your skin or if a part of the tick breaks off and is stuck in the skin.  After a tick  bite, you need to be aware of signs and symptoms that could be related to diseases spread by ticks. Contact your health care provider if you develop any of the following in the days or weeks after the tick bite:  Unexplained fever.  Rash. A circular rash that appears days or weeks after the tick bite may indicate the possibility of Lyme disease. The rash may resemble a target with a bull's-eye and may occur at a different part of your body than the tick bite.  Redness and swelling in the area of the tick bite.   Tender, swollen lymph glands.   Diarrhea.   Weight loss.   Cough.   Fatigue.   Muscle, joint, or bone pain.   Abdominal pain.   Headache.   Lethargy or a change in your level of consciousness.  Difficulty walking or moving your legs.   Numbness in the legs.   Paralysis.  Shortness of breath.   Confusion.   Repeated vomiting.    This information is not intended to replace advice given to you by your health care provider. Make sure you discuss any questions you have with your health care provider.   Document Released: 04/23/2000 Document Revised:  05/17/2014 Document Reviewed: 10/04/2012 Elsevier Interactive Patient Education [employer].

## 2015-09-04 ENCOUNTER — Other Ambulatory Visit: Payer: Self-pay | Admitting: Physician Assistant

## 2015-09-05 NOTE — Telephone Encounter (Signed)
Medication refilled per protocol. 

## 2015-10-07 ENCOUNTER — Other Ambulatory Visit: Payer: Self-pay | Admitting: Physician Assistant

## 2015-10-07 NOTE — Telephone Encounter (Signed)
Medication refilled per protocol. 

## 2015-10-27 ENCOUNTER — Encounter: Payer: Self-pay | Admitting: Physician Assistant

## 2015-10-27 ENCOUNTER — Ambulatory Visit (INDEPENDENT_AMBULATORY_CARE_PROVIDER_SITE_OTHER): Payer: 59 | Admitting: Physician Assistant

## 2015-10-27 VITALS — BP 84/60 | HR 68 | Temp 97.7°F | Resp 18 | Wt 168.0 lb

## 2015-10-27 DIAGNOSIS — K219 Gastro-esophageal reflux disease without esophagitis: Secondary | ICD-10-CM

## 2015-10-27 DIAGNOSIS — E785 Hyperlipidemia, unspecified: Secondary | ICD-10-CM | POA: Diagnosis not present

## 2015-10-27 DIAGNOSIS — Z125 Encounter for screening for malignant neoplasm of prostate: Secondary | ICD-10-CM

## 2015-10-27 DIAGNOSIS — R739 Hyperglycemia, unspecified: Secondary | ICD-10-CM | POA: Diagnosis not present

## 2015-10-27 DIAGNOSIS — K589 Irritable bowel syndrome without diarrhea: Secondary | ICD-10-CM | POA: Diagnosis not present

## 2015-10-27 LAB — COMPLETE METABOLIC PANEL WITH GFR
ALT: 19 U/L (ref 9–46)
AST: 19 U/L (ref 10–35)
Albumin: 4.2 g/dL (ref 3.6–5.1)
Alkaline Phosphatase: 54 U/L (ref 40–115)
BILIRUBIN TOTAL: 0.7 mg/dL (ref 0.2–1.2)
BUN: 21 mg/dL (ref 7–25)
CO2: 26 mmol/L (ref 20–31)
Calcium: 9.2 mg/dL (ref 8.6–10.3)
Chloride: 106 mmol/L (ref 98–110)
Creat: 0.71 mg/dL (ref 0.70–1.33)
GFR, Est African American: >89 mL/min (ref >=60)
GLUCOSE: 112 mg/dL — AB (ref 70–99)
POTASSIUM: 4.4 mmol/L (ref 3.5–5.3)
SODIUM: 140 mmol/L (ref 135–146)
TOTAL PROTEIN: 6.6 g/dL (ref 6.1–8.1)

## 2015-10-27 LAB — HEMOGLOBIN A1C
Hgb A1c MFr Bld: 5.8 % — ABNORMAL HIGH (ref <5.7)
Mean Plasma Glucose: 120 mg/dL

## 2015-10-27 LAB — LIPID PANEL
CHOL/HDL RATIO: 3 ratio (ref <=5.0)
Cholesterol: 176 mg/dL (ref 125–200)
HDL: 58 mg/dL (ref >=40)
LDL CALC: 104 mg/dL (ref <130)
Triglycerides: 72 mg/dL (ref <150)
VLDL: 14 mg/dL (ref <30)
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
## 2015-10-28 LAB — PSA: PSA: 0.63 ng/mL (ref <=4.00)

## 2015-10-29 MED ORDER — PRAVASTATIN SODIUM 20 MG PO TABS: 20.0000 mg | ORAL_TABLET | Freq: Every day | ORAL | Status: DC

## 2015-10-29 NOTE — Addendum Note (Signed)
Addended by: Jerl Santos R on: 10/29/2015 12:40 PM   Modules accepted: Orders

## 2015-12-10 ENCOUNTER — Other Ambulatory Visit: Payer: Self-pay | Admitting: Family Medicine

## 2015-12-10 MED ORDER — CILIDINIUM-CHLORDIAZEPOXIDE 2.5-5 MG PO CAPS: ORAL_CAPSULE | ORAL | 2 refills | Status: DC

## 2015-12-10 NOTE — Telephone Encounter (Signed)
Rx called in 

## 2015-12-13 ENCOUNTER — Encounter (HOSPITAL_COMMUNITY): Payer: Self-pay | Admitting: Emergency Medicine

## 2015-12-13 ENCOUNTER — Emergency Department (HOSPITAL_COMMUNITY): Payer: 59

## 2015-12-13 ENCOUNTER — Emergency Department (HOSPITAL_COMMUNITY)
Admission: EM | Admit: 2015-12-13 | Discharge: 2015-12-13 | Disposition: A | Discharge status: Home / Self Care | Payer: 59 | Attending: Emergency Medicine | Admitting: Emergency Medicine

## 2015-12-13 DIAGNOSIS — F17210 Nicotine dependence, cigarettes, uncomplicated: Secondary | ICD-10-CM | POA: Diagnosis not present

## 2015-12-13 DIAGNOSIS — R079 Chest pain, unspecified: Secondary | ICD-10-CM

## 2015-12-13 LAB — BASIC METABOLIC PANEL
Anion gap: 4 — ABNORMAL LOW (ref 5–15)
BUN: 20 mg/dL (ref 6–20)
CHLORIDE: 109 mmol/L (ref 101–111)
CO2: 29 mmol/L (ref 22–32)
CREATININE: 0.82 mg/dL (ref 0.61–1.24)
Calcium: 9.7 mg/dL (ref 8.9–10.3)
GFR calc Af Amer: >60 mL/min (ref >60)
GFR calc non Af Amer: >60 mL/min (ref >60)
GLUCOSE: 123 mg/dL — AB (ref 65–99)
POTASSIUM: 3.8 mmol/L (ref 3.5–5.1)
SODIUM: 142 mmol/L (ref 135–145)

## 2015-12-13 LAB — HEPATIC FUNCTION PANEL
ALK PHOS: 59 U/L (ref 38–126)
ALT: 20 U/L (ref 17–63)
AST: 22 U/L (ref 15–41)
Albumin: 4.1 g/dL (ref 3.5–5.0)
BILIRUBIN TOTAL: 0.5 mg/dL (ref 0.3–1.2)
Bilirubin, Direct: <0.1 mg/dL — ABNORMAL LOW (ref 0.1–0.5)
TOTAL PROTEIN: 7.1 g/dL (ref 6.5–8.1)

## 2015-12-13 LAB — CBC
HEMATOCRIT: 47.7 % (ref 39.0–52.0)
Hemoglobin: 15.9 g/dL (ref 13.0–17.0)
MCH: 32.7 pg (ref 26.0–34.0)
MCHC: 33.3 g/dL (ref 30.0–36.0)
MCV: 98.1 fL (ref 78.0–100.0)
Platelets: 176 10*3/uL (ref 150–400)
RBC: 4.86 MIL/uL (ref 4.22–5.81)
RDW: 13.4 % (ref 11.5–15.5)
WBC: 8 10*3/uL (ref 4.0–10.5)

## 2015-12-13 LAB — I-STAT TROPONIN, ED
Troponin i, poc: 0 ng/mL (ref 0.00–0.08)
Troponin i, poc: 0 ng/mL (ref 0.00–0.08)

## 2015-12-13 LAB — LIPASE, BLOOD: Lipase: 31 U/L (ref 11–51)

## 2015-12-13 MED ORDER — IOPAMIDOL (ISOVUE-370) INJECTION 76%
INTRAVENOUS | Status: AC
  Administered 2015-12-13: 100 mL
  Filled 2015-12-13: qty 100

## 2015-12-13 MED ORDER — GI COCKTAIL ~~LOC~~
30.0000 mL | Freq: Once | ORAL | Status: AC
  Administered 2015-12-13: 30 mL via ORAL

## 2015-12-13 MED ORDER — ASPIRIN 81 MG PO CHEW: 324.0000 mg | CHEWABLE_TABLET | Freq: Once | ORAL | Status: DC

## 2015-12-13 MED ORDER — ASPIRIN 81 MG PO CHEW
324.0000 mg | CHEWABLE_TABLET | Freq: Once | ORAL | Status: AC
  Administered 2015-12-13: 324 mg via ORAL
  Filled 2015-12-13: qty 4

## 2015-12-13 NOTE — ED Provider Notes (Signed)
4:25 PM: Patient signed out to me by S. Sam PA-C at shift change. He is a 55 year old male who presents with severe chest pain that woke him up. Currently waiting on CT chest to r/o dissection and second troponin. Anticipate d/c if CT is normal and second troponin is normal. Heart score of 3.   6:00 PM: CT chest negative for dissection. Patient is still having mild pain. Results reviewed with patient. Second troponin is normal. Will d/c with cardiology follow up for possible stress testing. Patient is NAD, non-toxic, with stable VS. Patient is informed of clinical course, understands medical decision making process, and agrees with plan. Opportunity for questions provided and all questions answered. Return precautions given.    Recardo Evangelist, PA-C 12/13/15 [id number]    Veryl Speak, MD 12/13/15 [phone]

## 2015-12-13 NOTE — ED Triage Notes (Signed)
Pt c/o chest pain that woke him up at 6am, left sided, around chest. Feels tired and weak. No hx of chest pain, has cough nonproductive.

## 2015-12-13 NOTE — ED Notes (Signed)
Pt. Transported to CT at this time.  

## 2015-12-13 NOTE — ED Notes (Signed)
Pt. Ambulated to restroom independently with no issues.
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
## 2016-01-05 ENCOUNTER — Encounter: Payer: Self-pay | Admitting: Cardiology
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
## 2016-01-20 ENCOUNTER — Encounter: Payer: Self-pay | Admitting: Cardiology

## 2016-01-20 ENCOUNTER — Ambulatory Visit (INDEPENDENT_AMBULATORY_CARE_PROVIDER_SITE_OTHER): Payer: 59 | Admitting: Cardiology

## 2016-01-20 VITALS — BP 115/72 | HR 67 | Ht 69.5 in | Wt 159.6 lb

## 2016-01-20 DIAGNOSIS — R079 Chest pain, unspecified: Secondary | ICD-10-CM

## 2016-01-20 NOTE — Patient Instructions (Addendum)

## 2016-01-21 ENCOUNTER — Other Ambulatory Visit: Payer: Self-pay | Admitting: Physician Assistant

## 2016-01-28 ENCOUNTER — Telehealth (HOSPITAL_COMMUNITY): Payer: Self-pay

## 2016-01-28 NOTE — Telephone Encounter (Signed)
Encounter complete. 

## 2016-01-29 ENCOUNTER — Ambulatory Visit (HOSPITAL_COMMUNITY)
Admission: RE | Admit: 2016-01-29 | Discharge: 2016-01-29 | Disposition: A | Discharge status: Home / Self Care | Payer: 59 | Source: Ambulatory Visit | Attending: Cardiology | Admitting: Cardiology

## 2016-01-29 DIAGNOSIS — R079 Chest pain, unspecified: Secondary | ICD-10-CM | POA: Insufficient documentation

## 2016-01-30 ENCOUNTER — Encounter (HOSPITAL_COMMUNITY): Payer: 59

## 2016-01-30 LAB — EXERCISE TOLERANCE TEST
CHL CUP MPHR: 167 {beats}/min
CHL CUP RESTING HR STRESS: 61 {beats}/min
CHL RATE OF PERCEIVED EXERTION: 17
CSEPEDS: 3 s
Estimated workload: 13.4 METS
Exercise duration (min): 11 min
Peak HR: 150 {beats}/min
Percent HR: 89 %

## 2016-02-07 ENCOUNTER — Emergency Department (HOSPITAL_COMMUNITY)
Admission: EM | Admit: 2016-02-07 | Discharge: 2016-02-07 | Disposition: A | Discharge status: Home / Self Care | Payer: 59 | Attending: Emergency Medicine | Admitting: Emergency Medicine

## 2016-02-07 ENCOUNTER — Encounter (HOSPITAL_COMMUNITY): Payer: Self-pay

## 2016-02-07 ENCOUNTER — Emergency Department (HOSPITAL_COMMUNITY): Payer: 59

## 2016-02-07 DIAGNOSIS — Z5181 Encounter for therapeutic drug level monitoring: Secondary | ICD-10-CM | POA: Insufficient documentation

## 2016-02-07 DIAGNOSIS — T1490XA Injury, unspecified, initial encounter: Secondary | ICD-10-CM

## 2016-02-07 DIAGNOSIS — Y929 Unspecified place or not applicable: Secondary | ICD-10-CM | POA: Insufficient documentation

## 2016-02-07 DIAGNOSIS — Y939 Activity, unspecified: Secondary | ICD-10-CM | POA: Diagnosis not present

## 2016-02-07 DIAGNOSIS — Z79899 Other long term (current) drug therapy: Secondary | ICD-10-CM | POA: Insufficient documentation

## 2016-02-07 DIAGNOSIS — S0990XA Unspecified injury of head, initial encounter: Secondary | ICD-10-CM

## 2016-02-07 DIAGNOSIS — Y999 Unspecified external cause status: Secondary | ICD-10-CM | POA: Insufficient documentation

## 2016-02-07 DIAGNOSIS — F17210 Nicotine dependence, cigarettes, uncomplicated: Secondary | ICD-10-CM | POA: Diagnosis not present

## 2016-02-07 DIAGNOSIS — W228XXA Striking against or struck by other objects, initial encounter: Secondary | ICD-10-CM | POA: Insufficient documentation

## 2016-02-07 DIAGNOSIS — S0191XA Laceration without foreign body of unspecified part of head, initial encounter: Secondary | ICD-10-CM | POA: Insufficient documentation

## 2016-02-07 LAB — BASIC METABOLIC PANEL
Anion gap: 9 (ref 5–15)
BUN: 20 mg/dL (ref 6–20)
CO2: 25 mmol/L (ref 22–32)
CREATININE: 0.95 mg/dL (ref 0.61–1.24)
Calcium: 9.7 mg/dL (ref 8.9–10.3)
Chloride: 108 mmol/L (ref 101–111)
GFR calc Af Amer: >60 mL/min (ref >60)
Glucose, Bld: 108 mg/dL — ABNORMAL HIGH (ref 65–99)
Potassium: 4.5 mmol/L (ref 3.5–5.1)
SODIUM: 142 mmol/L (ref 135–145)

## 2016-02-07 LAB — PROTIME-INR
INR: 1.01
PROTHROMBIN TIME: 13.3 s (ref 11.4–15.2)

## 2016-02-07 LAB — CBC WITH DIFFERENTIAL/PLATELET
Basophils Absolute: 0.1 10*3/uL (ref 0.0–0.1)
Basophils Relative: 1 %
EOS ABS: 0.1 10*3/uL (ref 0.0–0.7)
EOS PCT: 1 %
HCT: 47.4 % (ref 39.0–52.0)
Hemoglobin: 15.2 g/dL (ref 13.0–17.0)
LYMPHS ABS: 2.4 10*3/uL (ref 0.7–4.0)
Lymphocytes Relative: 23 %
MCH: 32.1 pg (ref 26.0–34.0)
MCHC: 32.1 g/dL (ref 30.0–36.0)
MCV: 100.2 fL — ABNORMAL HIGH (ref 78.0–100.0)
MONO ABS: 0.6 10*3/uL (ref 0.1–1.0)
MONOS PCT: 6 %
Neutro Abs: 7.6 10*3/uL (ref 1.7–7.7)
Neutrophils Relative %: 69 %
PLATELETS: 200 10*3/uL (ref 150–400)
RBC: 4.73 MIL/uL (ref 4.22–5.81)
RDW: 13.6 % (ref 11.5–15.5)
WBC: 10.9 10*3/uL — ABNORMAL HIGH (ref 4.0–10.5)

## 2016-02-07 MED ORDER — ACETAMINOPHEN 500 MG PO TABS
1000.0000 mg | ORAL_TABLET | Freq: Once | ORAL | Status: AC
  Administered 2016-02-07: 1000 mg via ORAL
  Filled 2016-02-07: qty 2
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
## 2016-02-07 NOTE — ED Notes (Signed)
C-collar placed on pt. In triage while maintaining c-spine precautions

## 2016-02-07 NOTE — ED Notes (Signed)
Patient transported to CT scan . 

## 2016-04-29 ENCOUNTER — Encounter: Payer: Self-pay | Admitting: Physician Assistant

## 2016-04-29 ENCOUNTER — Ambulatory Visit (INDEPENDENT_AMBULATORY_CARE_PROVIDER_SITE_OTHER): Payer: 59 | Admitting: Physician Assistant

## 2016-04-29 VITALS — BP 110/60 | HR 62 | Temp 98.0°F | Resp 18 | Wt 158.0 lb

## 2016-04-29 DIAGNOSIS — R739 Hyperglycemia, unspecified: Secondary | ICD-10-CM | POA: Diagnosis not present

## 2016-04-29 DIAGNOSIS — E785 Hyperlipidemia, unspecified: Secondary | ICD-10-CM

## 2016-04-29 DIAGNOSIS — R630 Anorexia: Secondary | ICD-10-CM | POA: Diagnosis not present

## 2016-04-29 DIAGNOSIS — R634 Abnormal weight loss: Secondary | ICD-10-CM | POA: Diagnosis not present

## 2016-04-29 DIAGNOSIS — K219 Gastro-esophageal reflux disease without esophagitis: Secondary | ICD-10-CM

## 2016-04-29 DIAGNOSIS — K589 Irritable bowel syndrome without diarrhea: Secondary | ICD-10-CM

## 2016-04-29 LAB — COMPLETE METABOLIC PANEL WITH GFR
ALBUMIN: 3.6 g/dL (ref 3.6–5.1)
ALT: 17 U/L (ref 9–46)
AST: 17 U/L (ref 10–35)
Alkaline Phosphatase: 57 U/L (ref 40–115)
BILIRUBIN TOTAL: 0.2 mg/dL (ref 0.2–1.2)
BUN: 20 mg/dL (ref 7–25)
CALCIUM: 9.1 mg/dL (ref 8.6–10.3)
CO2: 22 mmol/L (ref 20–31)
CREATININE: 0.87 mg/dL (ref 0.70–1.33)
Chloride: 111 mmol/L — ABNORMAL HIGH (ref 98–110)
GFR, Est Non African American: >89 mL/min (ref >=60)
Glucose, Bld: 98 mg/dL (ref 70–99)
Potassium: 4.5 mmol/L (ref 3.5–5.3)
Sodium: 144 mmol/L (ref 135–146)
TOTAL PROTEIN: 6.1 g/dL (ref 6.1–8.1)

## 2016-04-29 LAB — CBC WITH DIFFERENTIAL/PLATELET
BASOS PCT: 1 %
Basophils Absolute: 66 cells/uL (ref 0–200)
EOS ABS: 264 {cells}/uL (ref 15–500)
Eosinophils Relative: 4 %
HEMATOCRIT: 44.4 % (ref 38.5–50.0)
HEMOGLOBIN: 14.2 g/dL (ref 13.0–17.0)
Lymphocytes Relative: 30 %
Lymphs Abs: 1980 cells/uL (ref 850–3900)
MCH: 32.1 pg (ref 27.0–33.0)
MCHC: 32 g/dL (ref 32.0–36.0)
MCV: 100.2 fL — AB (ref 80.0–100.0)
MONO ABS: 594 {cells}/uL (ref 200–950)
MPV: 11 fL (ref 7.5–12.5)
Monocytes Relative: 9 %
NEUTROS ABS: 3696 {cells}/uL (ref 1500–7800)
Neutrophils Relative %: 56 %
Platelets: 197 10*3/uL (ref 140–400)
RBC: 4.43 MIL/uL (ref 4.20–5.80)
RDW: 13.4 % (ref 11.0–15.0)
WBC: 6.6 10*3/uL (ref 3.8–10.8)

## 2016-04-29 LAB — LIPID PANEL
CHOLESTEROL: 204 mg/dL — AB (ref <200)
HDL: 47 mg/dL (ref >40)
LDL Cholesterol: 134 mg/dL — ABNORMAL HIGH (ref <100)
Total CHOL/HDL Ratio: 4.3 Ratio (ref <5.0)
Triglycerides: 116 mg/dL (ref <150)
VLDL: 23 mg/dL (ref <30)

## 2016-04-29 LAB — TSH: TSH: 0.52 m[IU]/L (ref 0.40–4.50)
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
## 2016-04-30 LAB — RPR

## 2016-04-30 LAB — HEPATITIS PANEL, ACUTE
HCV Ab: NEGATIVE
HEP A IGM: NONREACTIVE
Hep B C IgM: NONREACTIVE
Hepatitis B Surface Ag: NEGATIVE

## 2016-04-30 LAB — TESTOSTERONE: TESTOSTERONE: 579 ng/dL (ref 250–827)

## 2016-04-30 LAB — HIV ANTIBODY (ROUTINE TESTING W REFLEX): HIV: NONREACTIVE

## 2016-05-13 ENCOUNTER — Ambulatory Visit: Payer: 59 | Admitting: Physician Assistant

## 2016-05-17 ENCOUNTER — Encounter: Payer: Self-pay | Admitting: Physician Assistant

## 2016-05-31 ENCOUNTER — Ambulatory Visit: Payer: 59 | Admitting: Physician Assistant

## 2016-07-06 ENCOUNTER — Other Ambulatory Visit: Payer: Self-pay | Admitting: Physician Assistant

## 2016-07-06 DIAGNOSIS — K219 Gastro-esophageal reflux disease without esophagitis: Secondary | ICD-10-CM

## 2016-07-06 NOTE — Telephone Encounter (Signed)
Rx filled per protocol  

## 2016-08-12 ENCOUNTER — Other Ambulatory Visit: Payer: Self-pay | Admitting: Physician Assistant

## 2016-08-12 DIAGNOSIS — K219 Gastro-esophageal reflux disease without esophagitis: Secondary | ICD-10-CM

## 2016-08-13 NOTE — Telephone Encounter (Signed)
Refill appropriate patient due for office visit, letter mailed.

## 2016-08-31 ENCOUNTER — Encounter: Payer: Self-pay | Admitting: Physician Assistant

## 2016-10-01 ENCOUNTER — Encounter: Payer: Self-pay | Admitting: Physician Assistant

## 2016-10-11 ENCOUNTER — Other Ambulatory Visit: Payer: Self-pay | Admitting: Physician Assistant

## 2016-10-11 DIAGNOSIS — K219 Gastro-esophageal reflux disease without esophagitis: Secondary | ICD-10-CM

## 2016-10-11 NOTE — Telephone Encounter (Signed)
Refill appropriate 

## 2016-10-18 ENCOUNTER — Ambulatory Visit (INDEPENDENT_AMBULATORY_CARE_PROVIDER_SITE_OTHER): Payer: 59 | Admitting: Physician Assistant

## 2016-10-18 ENCOUNTER — Encounter: Payer: Self-pay | Admitting: Physician Assistant

## 2016-10-18 VITALS — BP 100/70 | HR 68 | Temp 97.6°F | Resp 16 | Wt 162.6 lb

## 2016-10-18 DIAGNOSIS — K589 Irritable bowel syndrome without diarrhea: Secondary | ICD-10-CM | POA: Diagnosis not present

## 2016-10-18 DIAGNOSIS — K219 Gastro-esophageal reflux disease without esophagitis: Secondary | ICD-10-CM

## 2016-10-18 DIAGNOSIS — E785 Hyperlipidemia, unspecified: Secondary | ICD-10-CM | POA: Diagnosis not present

## 2016-10-18 DIAGNOSIS — R7301 Impaired fasting glucose: Secondary | ICD-10-CM | POA: Diagnosis not present

## 2016-10-18 DIAGNOSIS — R739 Hyperglycemia, unspecified: Secondary | ICD-10-CM

## 2016-10-18 DIAGNOSIS — F17200 Nicotine dependence, unspecified, uncomplicated: Secondary | ICD-10-CM | POA: Diagnosis not present

## 2016-10-18 LAB — COMPLETE METABOLIC PANEL WITH GFR
ALBUMIN: 4.2 g/dL (ref 3.6–5.1)
ALK PHOS: 58 U/L (ref 40–115)
ALT: 18 U/L (ref 9–46)
AST: 20 U/L (ref 10–35)
BILIRUBIN TOTAL: 0.5 mg/dL (ref 0.2–1.2)
BUN: 23 mg/dL (ref 7–25)
CALCIUM: 9.1 mg/dL (ref 8.6–10.3)
CHLORIDE: 107 mmol/L (ref 98–110)
CO2: 23 mmol/L (ref 20–31)
Creat: 0.8 mg/dL (ref 0.70–1.33)
GFR, Est Non African American: >89 mL/min (ref >=60)
Glucose, Bld: 91 mg/dL (ref 70–99)
Potassium: 4.7 mmol/L (ref 3.5–5.3)
Sodium: 140 mmol/L (ref 135–146)
Total Protein: 6.9 g/dL (ref 6.1–8.1)

## 2016-10-18 LAB — LIPID PANEL
CHOLESTEROL: 177 mg/dL (ref <200)
HDL: 55 mg/dL (ref >40)
LDL Cholesterol: 111 mg/dL — ABNORMAL HIGH (ref <100)
TRIGLYCERIDES: 57 mg/dL (ref <150)
Total CHOL/HDL Ratio: 3.2 Ratio (ref <5.0)
VLDL: 11 mg/dL (ref <30)
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
## 2016-11-12 ENCOUNTER — Other Ambulatory Visit: Payer: Self-pay | Admitting: Physician Assistant

## 2016-11-12 DIAGNOSIS — E785 Hyperlipidemia, unspecified: Secondary | ICD-10-CM

## 2016-11-12 NOTE — Telephone Encounter (Signed)
Refill appropriate 

## 2016-11-17 ENCOUNTER — Encounter: Payer: Self-pay | Admitting: Physician Assistant

## 2016-11-17 ENCOUNTER — Ambulatory Visit (INDEPENDENT_AMBULATORY_CARE_PROVIDER_SITE_OTHER): Payer: 59 | Admitting: Physician Assistant

## 2016-11-17 VITALS — BP 108/70 | HR 67 | Temp 97.5°F | Resp 16 | Ht 69.5 in | Wt 165.0 lb

## 2016-11-17 DIAGNOSIS — M62838 Other muscle spasm: Secondary | ICD-10-CM | POA: Diagnosis not present
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
## 2016-12-16 ENCOUNTER — Encounter: Payer: Self-pay | Admitting: Physician Assistant

## 2017-01-13 ENCOUNTER — Encounter: Payer: Self-pay | Admitting: Physician Assistant

## 2017-01-20 ENCOUNTER — Telehealth: Payer: Self-pay | Admitting: Physician Assistant

## 2017-01-20 NOTE — Telephone Encounter (Signed)
Another MD took him off the Pravastatin and his legs/muscles stopped hurting, so he can not take Simvastatin or Pravastatin. Do you have any other recommendations for cholesterol medication?

## 2017-01-20 NOTE — Telephone Encounter (Signed)
Recommend that he stay off cholesterol medications. Come in and recheck FLP/LFT in 3 months after being off of medication for 3 months.

## 2017-01-20 NOTE — Telephone Encounter (Signed)
Patient aware of providers recommendations via vm. Pt called back and appt made.

## 2017-02-03 DIAGNOSIS — D224 Melanocytic nevi of scalp and neck: Secondary | ICD-10-CM | POA: Diagnosis not present

## 2017-02-03 DIAGNOSIS — L718 Other rosacea: Secondary | ICD-10-CM | POA: Diagnosis not present

## 2017-02-03 DIAGNOSIS — L821 Other seborrheic keratosis: Secondary | ICD-10-CM | POA: Diagnosis not present

## 2017-02-07 ENCOUNTER — Other Ambulatory Visit: Payer: Self-pay | Admitting: Physician Assistant

## 2017-02-07 NOTE — Telephone Encounter (Signed)
Approved! - $1.00

## 2017-02-07 NOTE — Telephone Encounter (Signed)
Medication called to pharmacy. 

## 2017-02-07 NOTE — Telephone Encounter (Signed)
Ok to refill 

## 2017-02-11 DIAGNOSIS — T1502XA Foreign body in cornea, left eye, initial encounter: Secondary | ICD-10-CM | POA: Diagnosis not present

## 2017-02-14 ENCOUNTER — Other Ambulatory Visit: Payer: Self-pay | Admitting: Physician Assistant

## 2017-02-14 DIAGNOSIS — T1502XD Foreign body in cornea, left eye, subsequent encounter: Secondary | ICD-10-CM | POA: Diagnosis not present

## 2017-02-14 DIAGNOSIS — K219 Gastro-esophageal reflux disease without esophagitis: Secondary | ICD-10-CM

## 2017-02-24 ENCOUNTER — Ambulatory Visit (INDEPENDENT_AMBULATORY_CARE_PROVIDER_SITE_OTHER): Payer: 59 | Admitting: Physician Assistant

## 2017-02-24 ENCOUNTER — Encounter: Payer: Self-pay | Admitting: Physician Assistant

## 2017-02-24 VITALS — BP 122/80 | HR 68 | Temp 98.1°F | Resp 16 | Ht 69.5 in | Wt 167.2 lb

## 2017-02-24 DIAGNOSIS — E785 Hyperlipidemia, unspecified: Secondary | ICD-10-CM | POA: Diagnosis not present

## 2017-02-24 LAB — LIPID PANEL
Cholesterol: 206 mg/dL — ABNORMAL HIGH (ref <200)
HDL: 60 mg/dL (ref >40)
LDL Cholesterol (Calc): 127 mg/dL (calc) — ABNORMAL HIGH
NON-HDL CHOLESTEROL (CALC): 146 mg/dL — AB (ref <130)
Total CHOL/HDL Ratio: 3.4 (calc) (ref <5.0)
Triglycerides: 88 mg/dL (ref <150)

## 2017-02-24 LAB — HEPATIC FUNCTION PANEL
AG Ratio: 1.7 (calc) (ref 1.0–2.5)
ALBUMIN MSPROF: 3.9 g/dL (ref 3.6–5.1)
ALT: 18 U/L (ref 9–46)
AST: 20 U/L (ref 10–35)
Alkaline phosphatase (APISO): 73 U/L (ref 40–115)
BILIRUBIN DIRECT: 0 mg/dL (ref 0.0–0.2)
GLOBULIN: 2.3 g/dL (ref 1.9–3.7)
Indirect Bilirubin: 0.3 mg/dL (calc) (ref 0.2–1.2)
Total Bilirubin: 0.3 mg/dL (ref 0.2–1.2)
Total Protein: 6.2 g/dL (ref 6.1–8.1)
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
## 2017-05-16 DIAGNOSIS — S46011A Strain of muscle(s) and tendon(s) of the rotator cuff of right shoulder, initial encounter: Secondary | ICD-10-CM | POA: Diagnosis not present

## 2017-05-30 ENCOUNTER — Other Ambulatory Visit: Payer: Self-pay | Admitting: Physician Assistant

## 2017-05-30 NOTE — Telephone Encounter (Signed)
Refill appropriate 

## 2017-06-07 ENCOUNTER — Other Ambulatory Visit: Payer: Self-pay | Admitting: Physician Assistant

## 2017-06-07 NOTE — Telephone Encounter (Signed)
Last OV 10/18/218 Last refill  02/07/2017 Okay to refill

## 2017-06-13 DIAGNOSIS — S46011D Strain of muscle(s) and tendon(s) of the rotator cuff of right shoulder, subsequent encounter: Secondary | ICD-10-CM | POA: Diagnosis not present

## 2017-07-26 ENCOUNTER — Other Ambulatory Visit: Payer: Self-pay

## 2017-07-26 ENCOUNTER — Ambulatory Visit (INDEPENDENT_AMBULATORY_CARE_PROVIDER_SITE_OTHER): Payer: 59 | Admitting: Family Medicine

## 2017-07-26 ENCOUNTER — Encounter: Payer: Self-pay | Admitting: Family Medicine

## 2017-07-26 VITALS — BP 124/76 | HR 68 | Temp 98.4°F | Resp 14 | Ht 69.5 in | Wt 161.0 lb

## 2017-07-26 DIAGNOSIS — J0100 Acute maxillary sinusitis, unspecified: Secondary | ICD-10-CM | POA: Diagnosis not present

## 2017-07-26 DIAGNOSIS — R21 Rash and other nonspecific skin eruption: Secondary | ICD-10-CM

## 2017-07-26 MED ORDER — CLOTRIMAZOLE-BETAMETHASONE 1-0.05 % EX CREA: 1.0000 "application " | TOPICAL_CREAM | Freq: Two times a day (BID) | CUTANEOUS | 0 refills | Status: DC

## 2017-07-26 MED ORDER — AMOXICILLIN 875 MG PO TABS: 875.0000 mg | ORAL_TABLET | Freq: Two times a day (BID) | ORAL | 0 refills | Status: DC

## 2017-07-26 NOTE — Progress Notes (Signed)
   Subjective:    Patient ID: Tony Montoya, male    DOB: 05-12-1960, 57 y.o.   MRN: [id number]  Patient presents for Illness (x3 days- sinus pressure, HA, nasal drainage, productive cough with yellow sputum, denies fever/ muscle aches)  Headache, drainage, muscel aches back of [place], a little cough for past 5-6 days . ,no fever   Taking a few a days of amoxicllin took 4 doses  , zyrtec dayquil, nyquil +sick contacts with work   He has a rash that has been on his right lower abdomen for the past 2 months.  States that he tried his rosacea cream this did not help.  It does itch.  Works outside  [place]:  GEN- denies fatigue, fever, weight loss,weakness, recent illness HEENT- denies eye drainage, change in vision,+ nasal discharge, CVS- denies chest pain, palpitations RESP- denies SOB, +cough, wheeze ABD- denies N/V, change in stools, abd pain GU- denies dysuria, hematuria, dribbling, incontinence MSK- denies joint pain, muscle aches, injury Neuro- denies headache, dizziness, syncope, seizure activity       Objective:    BP 124/76   Pulse 68   Temp 98.4 F (36.9 C) (Oral)   Resp 14   Ht 5' 9.5" (1.765 m)   Wt 161 lb (73 kg)   SpO2 99%   BMI 23.43 kg/m  GEN- NAD, alert and oriented x3 HEENT- PERRL, EOMI, non injected sclera, pink conjunctiva, MMM, oropharynx mild injection, TM clear bilat no effusion,  + maxillary sinus tenderness, inflammed turbinates,  Nasal drainage  Neck- Supple, no LAD CVS- RRR, no murmur RESP-CTAB Skin- circular erythematous scaley patch 2cm on right lower abd,NT Pulses- Radial 2+          Assessment & Plan:      Problem List Items Addressed This Visit    None    Visit Diagnoses    Skin rash    -  Primary   posssible tinea or other dermatitis, trial of lotrisone   Acute maxillary sinusitis, recurrence not specified       discussed tobacco cesssation he is cutting down, now , 1ppd but dips. amoxicllin ,xyzal sample  given, nasal saline   Relevant Medications   amoxicillin (AMOXIL) 875 MG tablet      Note: This dictation was prepared with Dragon dictation along with smaller phrase technology. Any transcriptional errors that result from this process are unintentional.

## 2017-07-26 NOTE — Patient Instructions (Addendum)
Take xyzal Try nasal saline or flonase Antibiotics amoxicillin  lotrisone for the rash  F/U as needed

## 2017-08-09 ENCOUNTER — Other Ambulatory Visit: Payer: Self-pay | Admitting: Family Medicine

## 2017-08-10 ENCOUNTER — Telehealth: Payer: Self-pay

## 2017-08-10 MED ORDER — AMOXICILLIN-POT CLAVULANATE 875-125 MG PO TABS: 1.0000 | ORAL_TABLET | Freq: Two times a day (BID) | ORAL | 0 refills | Status: DC

## 2017-08-10 NOTE — Telephone Encounter (Signed)
Patient was in office on 3/19 for sinus issues and states he still has runny nose and congestion. Patient states his symptoms started to improve but then got worse again and is requesting another round of antibiotics. Pls advise on rx

## 2017-08-10 NOTE — Telephone Encounter (Signed)
This sounds more like allergy symptoms.  Will recommend that he take Zyrtec or Claritin also use Sudafed for a few days. She is having true sinusitis fever sinus pressure headache he does not need a second round of antibiotics after completing 10 days worth.  If he does have the actual sinusitis symptoms still do the above for the allergies and add Augmentin 875 1 twice a day for 7 days

## 2017-08-10 NOTE — Telephone Encounter (Signed)
RX sent to pharmacy patient is aware

## 2017-08-25 ENCOUNTER — Ambulatory Visit: Payer: 59 | Admitting: Physician Assistant

## 2017-08-29 ENCOUNTER — Ambulatory Visit: Payer: 59 | Admitting: Physician Assistant

## 2017-11-29 ENCOUNTER — Encounter: Payer: Self-pay | Admitting: Physician Assistant

## 2018-04-26 ENCOUNTER — Other Ambulatory Visit: Payer: Self-pay | Admitting: Family Medicine

## 2018-04-26 MED ORDER — ALPRAZOLAM 0.5 MG PO TABS: ORAL_TABLET | ORAL | 0 refills | Status: DC

## 2018-04-26 NOTE — Telephone Encounter (Signed)
[place] and needs xanax called in to [facility]. He is flying out on 04/28/2018.

## 2018-04-26 NOTE — Telephone Encounter (Signed)
Pt is requesting refill on Xanax   LOV: 07/26/17  LRF:   06/08/17 Pt has apt scheduled with Dr. Dennard Schaumann on 05/09/18

## 2018-04-28 ENCOUNTER — Other Ambulatory Visit: Payer: Self-pay | Admitting: Family Medicine

## 2018-05-05 ENCOUNTER — Ambulatory Visit: Payer: Self-pay | Admitting: Family Medicine

## 2018-05-09 ENCOUNTER — Encounter: Payer: Self-pay | Admitting: Family Medicine

## 2018-05-09 ENCOUNTER — Ambulatory Visit: Payer: 59 | Admitting: Family Medicine

## 2018-05-09 VITALS — BP 120/70 | HR 80 | Temp 98.0°F | Resp 14 | Ht 69.5 in | Wt 166.0 lb

## 2018-05-09 DIAGNOSIS — M545 Low back pain, unspecified: Secondary | ICD-10-CM

## 2018-05-09 DIAGNOSIS — Z125 Encounter for screening for malignant neoplasm of prostate: Secondary | ICD-10-CM | POA: Diagnosis not present

## 2018-05-09 DIAGNOSIS — Z13220 Encounter for screening for lipoid disorders: Secondary | ICD-10-CM

## 2018-05-09 LAB — URINALYSIS, ROUTINE W REFLEX MICROSCOPIC
BILIRUBIN URINE: NEGATIVE
Glucose, UA: NEGATIVE
Hgb urine dipstick: NEGATIVE
Ketones, ur: NEGATIVE
Leukocytes, UA: NEGATIVE
Nitrite: NEGATIVE
PH: 5.5 (ref 5.0–8.0)
Protein, ur: NEGATIVE
SPECIFIC GRAVITY, URINE: 1.024 (ref 1.001–1.03)
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
## 2018-05-10 LAB — LIPID PANEL
Cholesterol: 226 mg/dL — ABNORMAL HIGH (ref <200)
HDL: 62 mg/dL (ref >40)
LDL Cholesterol (Calc): 148 mg/dL (calc) — ABNORMAL HIGH
Non-HDL Cholesterol (Calc): 164 mg/dL (calc) — ABNORMAL HIGH (ref <130)
TRIGLYCERIDES: 68 mg/dL (ref <150)
Total CHOL/HDL Ratio: 3.6 (calc) (ref <5.0)

## 2018-05-10 LAB — COMPLETE METABOLIC PANEL WITH GFR
AG RATIO: 1.8 (calc) (ref 1.0–2.5)
ALT: 27 U/L (ref 9–46)
AST: 23 U/L (ref 10–35)
Albumin: 4.4 g/dL (ref 3.6–5.1)
Alkaline phosphatase (APISO): 62 U/L (ref 40–115)
BILIRUBIN TOTAL: 0.8 mg/dL (ref 0.2–1.2)
BUN: 19 mg/dL (ref 7–25)
CHLORIDE: 107 mmol/L (ref 98–110)
CO2: 24 mmol/L (ref 20–32)
Calcium: 9.6 mg/dL (ref 8.6–10.3)
Creat: 0.76 mg/dL (ref 0.70–1.33)
GFR, Est African American: 119 mL/min/{1.73_m2} (ref >=60)
GFR, Est Non African American: 103 mL/min/{1.73_m2} (ref >=60)
Globulin: 2.5 g/dL (calc) (ref 1.9–3.7)
Glucose, Bld: 88 mg/dL (ref 65–99)
POTASSIUM: 3.9 mmol/L (ref 3.5–5.3)
Sodium: 141 mmol/L (ref 135–146)
Total Protein: 6.9 g/dL (ref 6.1–8.1)

## 2018-05-10 LAB — CBC WITH DIFFERENTIAL/PLATELET
ABSOLUTE MONOCYTES: 704 {cells}/uL (ref 200–950)
BASOS ABS: 53 {cells}/uL (ref 0–200)
Basophils Relative: 0.6 %
EOS ABS: 167 {cells}/uL (ref 15–500)
Eosinophils Relative: 1.9 %
HCT: 42.1 % (ref 38.5–50.0)
Hemoglobin: 14.8 g/dL (ref 13.2–17.1)
Lymphs Abs: 2666 cells/uL (ref 850–3900)
MCH: 33.3 pg — AB (ref 27.0–33.0)
MCHC: 35.2 g/dL (ref 32.0–36.0)
MCV: 94.6 fL (ref 80.0–100.0)
MPV: 11.6 fL (ref 7.5–12.5)
Monocytes Relative: 8 %
NEUTROS PCT: 59.2 %
Neutro Abs: 5210 cells/uL (ref 1500–7800)
PLATELETS: 216 10*3/uL (ref 140–400)
RBC: 4.45 10*6/uL (ref 4.20–5.80)
RDW: 12.2 % (ref 11.0–15.0)
TOTAL LYMPHOCYTE: 30.3 %
WBC: 8.8 10*3/uL (ref 3.8–10.8)

## 2018-05-10 LAB — PSA: PSA: 0.5 ng/mL (ref <=4.0)

## 2018-05-16 ENCOUNTER — Other Ambulatory Visit: Payer: Self-pay | Admitting: Family Medicine

## 2018-05-16 MED ORDER — ATORVASTATIN CALCIUM 10 MG PO TABS: 10.0000 mg | ORAL_TABLET | Freq: Every day | ORAL | 1 refills | Status: DC

## 2018-06-04 ENCOUNTER — Emergency Department (HOSPITAL_COMMUNITY)
Admission: EM | Admit: 2018-06-04 | Discharge: 2018-06-05 | Disposition: A | Discharge status: Home / Self Care | Payer: 59 | Attending: Emergency Medicine | Admitting: Emergency Medicine

## 2018-06-04 ENCOUNTER — Other Ambulatory Visit: Payer: Self-pay

## 2018-06-04 ENCOUNTER — Encounter (HOSPITAL_COMMUNITY): Payer: Self-pay | Admitting: Emergency Medicine

## 2018-06-04 DIAGNOSIS — F17210 Nicotine dependence, cigarettes, uncomplicated: Secondary | ICD-10-CM | POA: Insufficient documentation

## 2018-06-04 DIAGNOSIS — F4323 Adjustment disorder with mixed anxiety and depressed mood: Secondary | ICD-10-CM | POA: Insufficient documentation

## 2018-06-04 DIAGNOSIS — K5732 Diverticulitis of large intestine without perforation or abscess without bleeding: Secondary | ICD-10-CM | POA: Diagnosis not present

## 2018-06-04 DIAGNOSIS — K5792 Diverticulitis of intestine, part unspecified, without perforation or abscess without bleeding: Secondary | ICD-10-CM

## 2018-06-04 DIAGNOSIS — Z79899 Other long term (current) drug therapy: Secondary | ICD-10-CM | POA: Insufficient documentation

## 2018-06-04 DIAGNOSIS — R1031 Right lower quadrant pain: Secondary | ICD-10-CM | POA: Diagnosis not present

## 2018-06-04 DIAGNOSIS — I440 Atrioventricular block, first degree: Secondary | ICD-10-CM | POA: Diagnosis not present

## 2018-06-04 DIAGNOSIS — R1032 Left lower quadrant pain: Secondary | ICD-10-CM | POA: Diagnosis not present

## 2018-06-04 DIAGNOSIS — M549 Dorsalgia, unspecified: Secondary | ICD-10-CM | POA: Diagnosis present

## 2018-06-04 LAB — COMPREHENSIVE METABOLIC PANEL
ALT: 31 U/L (ref 0–44)
AST: 28 U/L (ref 15–41)
Albumin: 4 g/dL (ref 3.5–5.0)
Alkaline Phosphatase: 59 U/L (ref 38–126)
Anion gap: 12 (ref 5–15)
BUN: 20 mg/dL (ref 6–20)
CO2: 20 mmol/L — ABNORMAL LOW (ref 22–32)
Calcium: 9.1 mg/dL (ref 8.9–10.3)
Chloride: 108 mmol/L (ref 98–111)
Creatinine, Ser: 0.82 mg/dL (ref 0.61–1.24)
GFR calc Af Amer: >60 mL/min (ref >60)
GFR calc non Af Amer: >60 mL/min (ref >60)
GLUCOSE: 113 mg/dL — AB (ref 70–99)
Potassium: 3.7 mmol/L (ref 3.5–5.1)
Sodium: 140 mmol/L (ref 135–145)
Total Bilirubin: 0.6 mg/dL (ref 0.3–1.2)
Total Protein: 6.9 g/dL (ref 6.5–8.1)

## 2018-06-04 LAB — CBC
HCT: 43.2 % (ref 39.0–52.0)
Hemoglobin: 14.3 g/dL (ref 13.0–17.0)
MCH: 32.6 pg (ref 26.0–34.0)
MCHC: 33.1 g/dL (ref 30.0–36.0)
MCV: 98.4 fL (ref 80.0–100.0)
Platelets: 198 10*3/uL (ref 150–400)
RBC: 4.39 MIL/uL (ref 4.22–5.81)
RDW: 13.1 % (ref 11.5–15.5)
WBC: 15 10*3/uL — ABNORMAL HIGH (ref 4.0–10.5)
nRBC: 0 % (ref 0.0–0.2)

## 2018-06-04 LAB — LIPASE, BLOOD: Lipase: 32 U/L (ref 11–51)

## 2018-06-04 MED ORDER — SODIUM CHLORIDE 0.9% FLUSH: 3.0000 mL | Freq: Once | INTRAVENOUS | Status: DC

## 2018-06-04 NOTE — ED Triage Notes (Signed)
C/ L sided abd pain and lower back pain since this morning that is gradually getting worse with nausea.  Denies vomiting and diarrhea.

## 2018-06-05 ENCOUNTER — Emergency Department (HOSPITAL_COMMUNITY): Payer: 59

## 2018-06-05 DIAGNOSIS — K5732 Diverticulitis of large intestine without perforation or abscess without bleeding: Secondary | ICD-10-CM | POA: Diagnosis not present

## 2018-06-05 LAB — URINALYSIS, ROUTINE W REFLEX MICROSCOPIC
BILIRUBIN URINE: NEGATIVE
Glucose, UA: NEGATIVE mg/dL
Hgb urine dipstick: NEGATIVE
Ketones, ur: NEGATIVE mg/dL
Leukocytes, UA: NEGATIVE
Nitrite: NEGATIVE
Protein, ur: NEGATIVE mg/dL
Specific Gravity, Urine: 1.034 — ABNORMAL HIGH (ref 1.005–1.030)
pH: 5 (ref 5.0–8.0)

## 2018-06-05 MED ORDER — CIPROFLOXACIN HCL 500 MG PO TABS: 500.0000 mg | ORAL_TABLET | Freq: Two times a day (BID) | ORAL | 0 refills | Status: DC

## 2018-06-05 MED ORDER — IOHEXOL 300 MG/ML  SOLN
100.0000 mL | Freq: Once | INTRAMUSCULAR | Status: AC | PRN
  Administered 2018-06-05: 100 mL via INTRAVENOUS

## 2018-06-05 MED ORDER — MORPHINE SULFATE (PF) 4 MG/ML IV SOLN
4.0000 mg | Freq: Once | INTRAVENOUS | Status: AC
  Administered 2018-06-05: 4 mg via INTRAVENOUS
  Filled 2018-06-05: qty 1

## 2018-06-05 MED ORDER — SODIUM CHLORIDE 0.9 % IV BOLUS
1000.0000 mL | Freq: Once | INTRAVENOUS | Status: AC
  Administered 2018-06-05: 1000 mL via INTRAVENOUS

## 2018-06-05 MED ORDER — ONDANSETRON 4 MG PO TBDP: 4.0000 mg | ORAL_TABLET | Freq: Three times a day (TID) | ORAL | 0 refills | Status: DC | PRN

## 2018-06-05 MED ORDER — CIPROFLOXACIN HCL 500 MG PO TABS
500.0000 mg | ORAL_TABLET | Freq: Once | ORAL | Status: AC
  Administered 2018-06-05: 500 mg via ORAL
  Filled 2018-06-05: qty 1

## 2018-06-05 MED ORDER — ONDANSETRON HCL 4 MG/2ML IJ SOLN
4.0000 mg | Freq: Once | INTRAMUSCULAR | Status: AC
  Administered 2018-06-05: 4 mg via INTRAVENOUS
  Filled 2018-06-05: qty 2

## 2018-06-05 MED ORDER — HYDROCODONE-ACETAMINOPHEN 5-325 MG PO TABS: 1.0000 | ORAL_TABLET | ORAL | 0 refills | Status: DC | PRN

## 2018-06-05 MED ORDER — METRONIDAZOLE 500 MG PO TABS
500.0000 mg | ORAL_TABLET | Freq: Once | ORAL | Status: AC
  Administered 2018-06-05: 500 mg via ORAL
  Filled 2018-06-05: qty 1

## 2018-06-05 MED ORDER — METRONIDAZOLE 500 MG PO TABS: 500.0000 mg | ORAL_TABLET | Freq: Two times a day (BID) | ORAL | 0 refills | Status: DC
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
## 2018-06-05 NOTE — ED Notes (Signed)
pts driving himself  He received morphine and has to wait at least 30 minutes before he can drive home

## 2018-06-05 NOTE — ED Notes (Signed)
The pt returned from c-t  He reports that the medicine did not help him at all

## 2018-06-05 NOTE — ED Notes (Addendum)
Patient taken to CT.

## 2018-06-27 ENCOUNTER — Encounter: Payer: Self-pay | Admitting: Internal Medicine

## 2018-07-03 ENCOUNTER — Other Ambulatory Visit: Payer: Self-pay | Admitting: Family Medicine

## 2018-07-03 ENCOUNTER — Telehealth: Payer: Self-pay | Admitting: Family Medicine

## 2018-07-03 MED ORDER — METRONIDAZOLE 500 MG PO TABS: 500.0000 mg | ORAL_TABLET | Freq: Two times a day (BID) | ORAL | 0 refills | Status: DC

## 2018-07-03 MED ORDER — CIPROFLOXACIN HCL 500 MG PO TABS: 500.0000 mg | ORAL_TABLET | Freq: Two times a day (BID) | ORAL | 0 refills | Status: DC

## 2018-07-03 NOTE — Telephone Encounter (Signed)
Pt called and wanted to know if he could have a refill of the doxycycline that they gave him for his diverticulitis as he is starting to have pain in his lower abd again? He does have an apt with you on 07/06/18

## 2018-07-03 NOTE — Telephone Encounter (Signed)
I'd treat diverticulitis with cipro and flagyl

## 2018-07-05 NOTE — Telephone Encounter (Signed)
Pt aware via vm 

## 2018-07-06 ENCOUNTER — Encounter: Payer: Self-pay | Admitting: Family Medicine

## 2018-07-06 ENCOUNTER — Ambulatory Visit: Payer: 59 | Admitting: Family Medicine

## 2018-07-06 VITALS — BP 130/70 | HR 80 | Temp 97.8°F | Resp 16 | Ht 69.5 in | Wt 170.0 lb

## 2018-07-06 DIAGNOSIS — Z1211 Encounter for screening for malignant neoplasm of colon: Secondary | ICD-10-CM

## 2018-07-06 DIAGNOSIS — K5792 Diverticulitis of intestine, part unspecified, without perforation or abscess without bleeding: Secondary | ICD-10-CM | POA: Diagnosis not present

## 2018-07-06 MED ORDER — ALPRAZOLAM 0.5 MG PO TABS: ORAL_TABLET | ORAL | 0 refills | Status: DC
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
## 2018-07-21 ENCOUNTER — Encounter: Payer: Self-pay | Admitting: Family Medicine

## 2018-07-21 ENCOUNTER — Ambulatory Visit: Payer: 59 | Admitting: Family Medicine

## 2018-07-21 ENCOUNTER — Other Ambulatory Visit: Payer: Self-pay

## 2018-07-21 VITALS — BP 120/72 | HR 72 | Temp 97.9°F | Resp 16 | Ht 69.5 in | Wt 171.0 lb

## 2018-07-21 DIAGNOSIS — E7800 Pure hypercholesterolemia, unspecified: Secondary | ICD-10-CM | POA: Diagnosis not present
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
## 2018-07-22 LAB — COMPLETE METABOLIC PANEL WITH GFR
AG Ratio: 1.5 (calc) (ref 1.0–2.5)
ALT: 34 U/L (ref 9–46)
AST: 27 U/L (ref 10–35)
Albumin: 3.9 g/dL (ref 3.6–5.1)
Alkaline phosphatase (APISO): 52 U/L (ref 35–144)
BUN: 16 mg/dL (ref 7–25)
CHLORIDE: 106 mmol/L (ref 98–110)
CO2: 24 mmol/L (ref 20–32)
Calcium: 9.6 mg/dL (ref 8.6–10.3)
Creat: 0.76 mg/dL (ref 0.70–1.33)
GFR, Est African American: 118 mL/min/{1.73_m2} (ref >=60)
GFR, Est Non African American: 102 mL/min/{1.73_m2} (ref >=60)
Globulin: 2.6 g/dL (calc) (ref 1.9–3.7)
Glucose, Bld: 70 mg/dL (ref 65–99)
Potassium: 4.2 mmol/L (ref 3.5–5.3)
Sodium: 140 mmol/L (ref 135–146)
Total Bilirubin: 0.4 mg/dL (ref 0.2–1.2)
Total Protein: 6.5 g/dL (ref 6.1–8.1)

## 2018-07-22 LAB — LDL CHOLESTEROL, DIRECT: Direct LDL: 99 mg/dL (ref <100)

## 2018-08-10 ENCOUNTER — Telehealth: Payer: Self-pay | Admitting: Family Medicine

## 2018-08-10 ENCOUNTER — Ambulatory Visit (INDEPENDENT_AMBULATORY_CARE_PROVIDER_SITE_OTHER): Payer: 59 | Admitting: Family Medicine

## 2018-08-10 ENCOUNTER — Other Ambulatory Visit: Payer: Self-pay

## 2018-08-10 DIAGNOSIS — M5432 Sciatica, left side: Secondary | ICD-10-CM

## 2018-08-10 MED ORDER — PREDNISONE 20 MG PO TABS: ORAL_TABLET | ORAL | 0 refills | Status: DC

## 2018-08-10 MED ORDER — HYDROCODONE-ACETAMINOPHEN 7.5-325 MG PO TABS: 1.0000 | ORAL_TABLET | Freq: Four times a day (QID) | ORAL | 0 refills | Status: DC | PRN

## 2018-08-10 NOTE — Telephone Encounter (Signed)
Patient would like a prescription for an anti inflammatory and a pain medication  Says he pinched a nerve
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
## 2018-08-11 ENCOUNTER — Telehealth: Payer: Self-pay | Admitting: Family Medicine

## 2018-08-11 DIAGNOSIS — M5416 Radiculopathy, lumbar region: Secondary | ICD-10-CM

## 2018-08-11 DIAGNOSIS — M5432 Sciatica, left side: Secondary | ICD-10-CM

## 2018-08-11 NOTE — Telephone Encounter (Signed)
Where does this referral need to go?

## 2018-08-11 NOTE — Telephone Encounter (Signed)
Pt wants [place] to place referral for back pain, the hydrocodone is not helping

## 2018-08-14 NOTE — Telephone Encounter (Signed)
MRI of lumbar spine would be the next step

## 2018-08-14 NOTE — Telephone Encounter (Signed)
MRI placed and pt aware via vm

## 2018-08-15 DIAGNOSIS — M5416 Radiculopathy, lumbar region: Secondary | ICD-10-CM | POA: Diagnosis not present

## 2018-08-15 DIAGNOSIS — M4316 Spondylolisthesis, lumbar region: Secondary | ICD-10-CM | POA: Diagnosis not present

## 2018-08-15 DIAGNOSIS — M546 Pain in thoracic spine: Secondary | ICD-10-CM | POA: Diagnosis not present

## 2018-08-15 DIAGNOSIS — Z135 Encounter for screening for eye and ear disorders: Secondary | ICD-10-CM | POA: Diagnosis not present

## 2018-08-15 DIAGNOSIS — M549 Dorsalgia, unspecified: Secondary | ICD-10-CM | POA: Diagnosis not present

## 2018-08-15 DIAGNOSIS — M47816 Spondylosis without myelopathy or radiculopathy, lumbar region: Secondary | ICD-10-CM | POA: Diagnosis not present

## 2018-08-15 DIAGNOSIS — M545 Low back pain: Secondary | ICD-10-CM | POA: Diagnosis not present

## 2018-08-16 DIAGNOSIS — M47816 Spondylosis without myelopathy or radiculopathy, lumbar region: Secondary | ICD-10-CM | POA: Diagnosis not present

## 2018-08-16 DIAGNOSIS — M4316 Spondylolisthesis, lumbar region: Secondary | ICD-10-CM | POA: Diagnosis not present

## 2018-08-16 DIAGNOSIS — M5126 Other intervertebral disc displacement, lumbar region: Secondary | ICD-10-CM | POA: Diagnosis not present

## 2018-08-22 DIAGNOSIS — M5126 Other intervertebral disc displacement, lumbar region: Secondary | ICD-10-CM | POA: Diagnosis not present

## 2018-08-22 DIAGNOSIS — M4726 Other spondylosis with radiculopathy, lumbar region: Secondary | ICD-10-CM | POA: Diagnosis not present

## 2018-08-22 DIAGNOSIS — M5116 Intervertebral disc disorders with radiculopathy, lumbar region: Secondary | ICD-10-CM | POA: Diagnosis not present

## 2018-08-23 ENCOUNTER — Ambulatory Visit: Payer: 59 | Admitting: Gastroenterology

## 2018-10-18 ENCOUNTER — Ambulatory Visit: Payer: 59 | Admitting: Gastroenterology

## 2018-11-08 ENCOUNTER — Other Ambulatory Visit: Payer: Self-pay | Admitting: Family Medicine

## 2018-11-08 NOTE — Telephone Encounter (Signed)
Requested Prescriptions   Pending Prescriptions Disp Refills  . ALPRAZolam (XANAX) 0.5 MG tablet [Pharmacy Med Name: ALPRAZOLAM 0.5 MG TAB] 10 tablet     Sig: TAKE 1 TABLET BY MOUTH 30 MINUTES PRIOR TO FLYING     Last OV 08/10/2018  Last written 07/06/2018

## 2019-04-20 ENCOUNTER — Other Ambulatory Visit: Payer: Self-pay | Admitting: Family Medicine

## 2019-04-20 NOTE — Telephone Encounter (Signed)
Ok to refill??  Last office visit 08/10/2018.  Last refill 11/08/2018.

## 2019-06-17 ENCOUNTER — Emergency Department (HOSPITAL_COMMUNITY)
Admission: EM | Admit: 2019-06-17 | Discharge: 2019-06-17 | Disposition: A | Discharge status: Home / Self Care | Payer: BC Managed Care – PPO | Attending: Emergency Medicine | Admitting: Emergency Medicine

## 2019-06-17 ENCOUNTER — Other Ambulatory Visit: Payer: Self-pay

## 2019-06-17 ENCOUNTER — Encounter (HOSPITAL_COMMUNITY): Payer: Self-pay | Admitting: Emergency Medicine

## 2019-06-17 ENCOUNTER — Ambulatory Visit (HOSPITAL_COMMUNITY)
Admission: EM | Admit: 2019-06-17 | Discharge: 2019-06-17 | Disposition: A | Discharge status: Home / Self Care | Payer: BC Managed Care – PPO | Source: Home / Self Care

## 2019-06-17 DIAGNOSIS — Y93H2 Activity, gardening and landscaping: Secondary | ICD-10-CM | POA: Diagnosis not present

## 2019-06-17 DIAGNOSIS — F17210 Nicotine dependence, cigarettes, uncomplicated: Secondary | ICD-10-CM | POA: Insufficient documentation

## 2019-06-17 DIAGNOSIS — Y929 Unspecified place or not applicable: Secondary | ICD-10-CM | POA: Diagnosis not present

## 2019-06-17 DIAGNOSIS — W208XXA Other cause of strike by thrown, projected or falling object, initial encounter: Secondary | ICD-10-CM | POA: Insufficient documentation

## 2019-06-17 DIAGNOSIS — S0502XA Injury of conjunctiva and corneal abrasion without foreign body, left eye, initial encounter: Secondary | ICD-10-CM | POA: Diagnosis not present

## 2019-06-17 DIAGNOSIS — Y999 Unspecified external cause status: Secondary | ICD-10-CM | POA: Insufficient documentation

## 2019-06-17 DIAGNOSIS — S0592XA Unspecified injury of left eye and orbit, initial encounter: Secondary | ICD-10-CM | POA: Diagnosis not present

## 2019-06-17 MED ORDER — FLUORESCEIN SODIUM 1 MG OP STRP
1.0000 | ORAL_STRIP | Freq: Once | OPHTHALMIC | Status: DC
  Filled 2019-06-17: qty 1

## 2019-06-17 MED ORDER — TETRACAINE HCL 0.5 % OP SOLN
2.0000 [drp] | Freq: Once | OPHTHALMIC | Status: AC
  Administered 2019-06-17: 17:00:00 2 [drp] via OPHTHALMIC
  Filled 2019-06-17: qty 4

## 2019-06-17 MED ORDER — ERYTHROMYCIN 5 MG/GM OP OINT: TOPICAL_OINTMENT | OPHTHALMIC | 0 refills | Status: DC

## 2019-06-17 NOTE — ED Notes (Signed)
Pt discharge and prescription education provided. Pt is alert and oriented x 4 and ambulatory at discharge. Pt verbalizes understanding.

## 2019-06-17 NOTE — ED Triage Notes (Signed)
Pt in POV. Reports he was cutting down a tree yesterday when a limb hit his L eye. Reports pain, blurred vision, drainage.

## 2019-06-17 NOTE — ED Notes (Signed)
Spoke with pt while registering. Pt states he was struck in eye by limb yesterday and "feels like there's still something in my eye and possibly inside my eyelid." Left eye very erythemic, watering. Pt states cannot tolerate any light to area.  Spoke with Lavell Anchors, NP about pt MOI and symptoms. Lavell Anchors advised pt to E.R. stat for further eval and tx 2/2 limited capability of urgent care for foreign body in eyes. Pt verbalized understanding and stated he would go to ER stat.
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
## 2019-06-17 NOTE — Discharge Instructions (Addendum)
You have been diagnosed today with corneal abrasion of the left eye.  At this time there does not appear to be the presence of an emergent medical condition, however there is always the potential for conditions to change. Please read and follow the below instructions.  Please return to the Emergency Department immediately for any new or worsening symptoms. Please be sure to follow up with your Primary Care Provider within one week regarding your visit today; please call their office to schedule an appointment even if you are feeling better for a follow-up visit. Your examination today was consistent with a small corneal ulcer.  Please use the antibiotic ointment erythromycin as prescribed for the next 5 days to avoid infection.  Please call your ophthalmologist tomorrow morning to schedule a follow-up appointment for within the next few days.  You may call your established ophthalmologist Dr. Bing Plume for an appointment.  If you are unable to see Dr. Bing Plume you may call our on-call ophthalmologist Dr. Prudencio Burly to schedule an appointment.  Get help right away if: You have very bad eye pain that does not get better with medicine. You lose eyesight. You have fever or chills You have swelling or color change You have any new/concerning or worsening of symptoms  Please read the additional information packets attached to your discharge summary.  Do not take your medicine if  develop an itchy rash, swelling in your mouth or lips, or difficulty breathing; call 911 and seek immediate emergency medical attention if this occurs.  Note: Portions of this text may have been transcribed using voice recognition software. Every effort was made to ensure accuracy; however, inadvertent computerized transcription errors may still be present.

## 2019-07-19 ENCOUNTER — Other Ambulatory Visit: Payer: Self-pay | Admitting: Family Medicine

## 2019-07-19 NOTE — Telephone Encounter (Signed)
Ok to refill??  Last office visit 08/10/2018.  Last refill 04/22/2019.

## 2019-08-25 ENCOUNTER — Ambulatory Visit (INDEPENDENT_AMBULATORY_CARE_PROVIDER_SITE_OTHER): Payer: BC Managed Care – PPO | Admitting: Ophthalmology

## 2019-08-25 ENCOUNTER — Encounter (INDEPENDENT_AMBULATORY_CARE_PROVIDER_SITE_OTHER): Payer: Self-pay | Admitting: Ophthalmology

## 2019-08-25 DIAGNOSIS — H1830 Unspecified corneal membrane change: Secondary | ICD-10-CM
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
## 2019-08-27 DIAGNOSIS — S0502XD Injury of conjunctiva and corneal abrasion without foreign body, left eye, subsequent encounter: Secondary | ICD-10-CM | POA: Diagnosis not present

## 2019-11-02 ENCOUNTER — Other Ambulatory Visit: Payer: Self-pay | Admitting: Family Medicine

## 2019-11-05 NOTE — Telephone Encounter (Signed)
Ok to refill??  Last office visit 08/10/2018.  Last refill 07/20/2019.  Of note, letter sent to patient to schedule OV.

## 2019-12-28 ENCOUNTER — Ambulatory Visit (INDEPENDENT_AMBULATORY_CARE_PROVIDER_SITE_OTHER): Payer: BC Managed Care – PPO | Admitting: Family Medicine

## 2019-12-28 ENCOUNTER — Other Ambulatory Visit: Payer: Self-pay

## 2019-12-28 ENCOUNTER — Encounter: Payer: Self-pay | Admitting: Family Medicine

## 2019-12-28 VITALS — BP 130/68 | HR 62 | Temp 98.7°F | Resp 14 | Ht 69.5 in | Wt 168.0 lb

## 2019-12-28 DIAGNOSIS — Z0000 Encounter for general adult medical examination without abnormal findings: Secondary | ICD-10-CM

## 2019-12-28 DIAGNOSIS — Z1211 Encounter for screening for malignant neoplasm of colon: Secondary | ICD-10-CM

## 2019-12-28 DIAGNOSIS — Z0001 Encounter for general adult medical examination with abnormal findings: Secondary | ICD-10-CM

## 2019-12-28 MED ORDER — SILDENAFIL CITRATE 100 MG PO TABS: 50.0000 mg | ORAL_TABLET | Freq: Every day | ORAL | 11 refills | Status: DC | PRN

## 2019-12-28 MED ORDER — ALPRAZOLAM 0.5 MG PO TABS: 0.5000 mg | ORAL_TABLET | Freq: Three times a day (TID) | ORAL | 0 refills | Status: DC | PRN
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
## 2019-12-31 ENCOUNTER — Other Ambulatory Visit: Payer: BC Managed Care – PPO

## 2019-12-31 ENCOUNTER — Other Ambulatory Visit: Payer: Self-pay

## 2019-12-31 DIAGNOSIS — R739 Hyperglycemia, unspecified: Secondary | ICD-10-CM

## 2019-12-31 DIAGNOSIS — Z0000 Encounter for general adult medical examination without abnormal findings: Secondary | ICD-10-CM | POA: Diagnosis not present

## 2019-12-31 DIAGNOSIS — Z13220 Encounter for screening for lipoid disorders: Secondary | ICD-10-CM

## 2020-01-01 ENCOUNTER — Other Ambulatory Visit: Payer: Self-pay

## 2020-01-01 DIAGNOSIS — E7800 Pure hypercholesterolemia, unspecified: Secondary | ICD-10-CM

## 2020-01-01 LAB — COMPLETE METABOLIC PANEL WITH GFR
AG Ratio: 1.7 (calc) (ref 1.0–2.5)
ALT: 19 U/L (ref 9–46)
AST: 21 U/L (ref 10–35)
Albumin: 4 g/dL (ref 3.6–5.1)
Alkaline phosphatase (APISO): 67 U/L (ref 35–144)
BUN: 15 mg/dL (ref 7–25)
CO2: 23 mmol/L (ref 20–32)
Calcium: 9 mg/dL (ref 8.6–10.3)
Chloride: 110 mmol/L (ref 98–110)
Creat: 0.76 mg/dL (ref 0.70–1.33)
GFR, Est African American: 117 mL/min/{1.73_m2} (ref >=60)
GFR, Est Non African American: 101 mL/min/{1.73_m2} (ref >=60)
Globulin: 2.4 g/dL (calc) (ref 1.9–3.7)
Glucose, Bld: 120 mg/dL — ABNORMAL HIGH (ref 65–99)
Potassium: 4.1 mmol/L (ref 3.5–5.3)
Sodium: 141 mmol/L (ref 135–146)
Total Bilirubin: 0.4 mg/dL (ref 0.2–1.2)
Total Protein: 6.4 g/dL (ref 6.1–8.1)

## 2020-01-01 LAB — LIPID PANEL
Cholesterol: 222 mg/dL — ABNORMAL HIGH (ref <200)
HDL: 51 mg/dL (ref >=40)
LDL Cholesterol (Calc): 136 mg/dL (calc) — ABNORMAL HIGH
Non-HDL Cholesterol (Calc): 171 mg/dL (calc) — ABNORMAL HIGH (ref <130)
Total CHOL/HDL Ratio: 4.4 (calc) (ref <5.0)
Triglycerides: 213 mg/dL — ABNORMAL HIGH (ref <150)

## 2020-01-01 LAB — CBC WITH DIFFERENTIAL/PLATELET
Absolute Monocytes: 445 cells/uL (ref 200–950)
Basophils Absolute: 37 cells/uL (ref 0–200)
Basophils Relative: 0.7 %
Eosinophils Absolute: 207 cells/uL (ref 15–500)
Eosinophils Relative: 3.9 %
HCT: 44.9 % (ref 38.5–50.0)
Hemoglobin: 15.2 g/dL (ref 13.2–17.1)
Lymphs Abs: 1935 cells/uL (ref 850–3900)
MCH: 33.6 pg — ABNORMAL HIGH (ref 27.0–33.0)
MCHC: 33.9 g/dL (ref 32.0–36.0)
MCV: 99.1 fL (ref 80.0–100.0)
MPV: 12 fL (ref 7.5–12.5)
Monocytes Relative: 8.4 %
Neutro Abs: 2677 cells/uL (ref 1500–7800)
Neutrophils Relative %: 50.5 %
Platelets: 202 10*3/uL (ref 140–400)
RBC: 4.53 10*6/uL (ref 4.20–5.80)
RDW: 12.1 % (ref 11.0–15.0)
Total Lymphocyte: 36.5 %
WBC: 5.3 10*3/uL (ref 3.8–10.8)

## 2020-01-01 LAB — PSA: PSA: 0.4 ng/mL (ref <=4.0)

## 2020-01-01 MED ORDER — ATORVASTATIN CALCIUM 20 MG PO TABS: 20.0000 mg | ORAL_TABLET | Freq: Every day | ORAL | 3 refills | Status: DC

## 2020-01-10 ENCOUNTER — Encounter: Payer: Self-pay | Admitting: Internal Medicine

## 2020-02-08 ENCOUNTER — Other Ambulatory Visit: Payer: Self-pay | Admitting: Family Medicine

## 2020-03-18 ENCOUNTER — Other Ambulatory Visit: Payer: Self-pay

## 2020-03-18 ENCOUNTER — Ambulatory Visit (AMBULATORY_SURGERY_CENTER): Payer: Self-pay | Admitting: *Deleted

## 2020-03-18 ENCOUNTER — Other Ambulatory Visit: Payer: Self-pay | Admitting: Family Medicine

## 2020-03-18 VITALS — Ht 69.5 in | Wt 165.0 lb

## 2020-03-18 DIAGNOSIS — Z86010 Personal history of colonic polyps: Secondary | ICD-10-CM

## 2020-03-18 MED ORDER — NA SULFATE-K SULFATE-MG SULF 17.5-3.13-1.6 GM/177ML PO SOLN: ORAL | 0 refills | Status: DC

## 2020-03-18 NOTE — Telephone Encounter (Signed)
Please advise 

## 2020-03-18 NOTE — Progress Notes (Signed)
Patient is here in-person for PV. Patient denies any allergies to eggs or soy. Patient denies any problems with anesthesia/sedation. Patient denies any oxygen use at home. Patient denies taking any diet/weight loss medications or blood thinners. Patient is not being treated for MRSA or C-diff. Patient is aware of our care-partner policy and NIOEV-03 safety protocol.   COVID-19 vaccines completed on 08/15/2019, per patient.   Prep Prescription coupon given to the patient.

## 2020-03-19 ENCOUNTER — Encounter: Payer: Self-pay | Admitting: Internal Medicine

## 2020-04-01 ENCOUNTER — Encounter: Payer: Self-pay | Admitting: Internal Medicine

## 2020-04-01 ENCOUNTER — Ambulatory Visit (AMBULATORY_SURGERY_CENTER): Payer: BC Managed Care – PPO | Admitting: Internal Medicine

## 2020-04-01 ENCOUNTER — Other Ambulatory Visit: Payer: Self-pay | Admitting: Internal Medicine

## 2020-04-01 ENCOUNTER — Other Ambulatory Visit: Payer: Self-pay

## 2020-04-01 VITALS — BP 95/54 | HR 45 | Temp 97.3°F | Resp 11 | Ht 69.5 in | Wt 165.0 lb

## 2020-04-01 DIAGNOSIS — K635 Polyp of colon: Secondary | ICD-10-CM

## 2020-04-01 DIAGNOSIS — D125 Benign neoplasm of sigmoid colon: Secondary | ICD-10-CM

## 2020-04-01 DIAGNOSIS — Z1211 Encounter for screening for malignant neoplasm of colon: Secondary | ICD-10-CM | POA: Diagnosis not present

## 2020-04-01 DIAGNOSIS — Z86010 Personal history of colonic polyps: Secondary | ICD-10-CM

## 2020-04-01 DIAGNOSIS — D123 Benign neoplasm of transverse colon: Secondary | ICD-10-CM | POA: Diagnosis not present

## 2020-04-01 MED ORDER — SODIUM CHLORIDE 0.9 % IV SOLN: 500.0000 mL | Freq: Once | INTRAVENOUS | Status: DC

## 2020-04-01 NOTE — Progress Notes (Signed)
Report given to PACU, vss 

## 2020-04-01 NOTE — Op Note (Signed)
[place] Patient Name: Tony Montoya Procedure Date: 04/01/2020 10:10 AM MRN: [id number] Endoscopist: Jerene Bears , MD Age: 59 Referring MD:  Date of Birth: Nov 04, 1960 Gender: Male Account #: [id number] Procedure:                Colonoscopy Indications:              High risk colon cancer surveillance: Personal                            history of non-advanced adenomas x 2, Last                            colonoscopy: December 2014 Medicines:                Monitored Anesthesia Care Procedure:                Pre-Anesthesia Assessment:                           - Prior to the procedure, a History and Physical                            was performed, and patient medications and                            allergies were reviewed. The patient's tolerance of                            previous anesthesia was also reviewed. The risks                            and benefits of the procedure and the sedation                            options and risks were discussed with the patient.                            All questions were answered, and informed consent                            was obtained. Prior Anticoagulants: The patient has                            taken no previous anticoagulant or antiplatelet                            agents. ASA Grade Assessment: II - A patient with                            mild systemic disease. After reviewing the risks                            and benefits, the patient was deemed in  satisfactory condition to undergo the procedure.                           After obtaining informed consent, the colonoscope                            was passed under direct vision. Throughout the                            procedure, the patient's blood pressure, pulse, and                            oxygen saturations were monitored continuously. The                            Colonoscope was introduced through the anus and                             advanced to the cecum, identified by appendiceal                            orifice and ileocecal valve. The colonoscopy was                            performed without difficulty. The patient tolerated                            the procedure well. The quality of the bowel                            preparation was good. The ileocecal valve,                            appendiceal orifice, and rectum were photographed. Scope In: 10:14:24 AM Scope Out: 10:31:34 AM Scope Withdrawal Time: 0 hours 13 minutes 54 seconds  Total Procedure Duration: 0 hours 17 minutes 10 seconds  Findings:                 The digital rectal exam was normal.                           Two sessile polyps were found in the transverse                            colon. The polyps were 4 to 6 mm in size. These                            polyps were removed with a cold snare. Resection                            and retrieval were complete.                           A 5 mm polyp was found in the sigmoid  colon. The                            polyp was sessile. The polyp was removed with a                            cold snare. Resection and retrieval were complete.                           Multiple small and large-mouthed diverticula were                            found in the sigmoid colon and descending colon.                           Internal hemorrhoids were found during                            retroflexion. The hemorrhoids were small. Complications:            No immediate complications. Estimated Blood Loss:     Estimated blood loss was minimal. Impression:               - Two 4 to 6 mm polyps in the transverse colon,                            removed with a cold snare. Resected and retrieved.                           - One 5 mm polyp in the sigmoid colon, removed with                            a cold snare. Resected and retrieved.                           - Diverticulosis in  the sigmoid colon and in the                            descending colon.                           - Small internal hemorrhoids. Recommendation:           - Patient has a contact number available for                            emergencies. The signs and symptoms of potential                            delayed complications were discussed with the                            patient. Return to normal activities tomorrow.  Written discharge instructions were provided to the                            patient.                           - Resume previous diet.                           - Continue present medications.                           - Await pathology results.                           - Repeat colonoscopy is recommended for                            surveillance. The colonoscopy date will be                            determined after pathology results from today's                            exam become available for review. Jerene Bears, MD 04/01/2020 10:35:55 AM This report has been signed electronically.

## 2020-04-01 NOTE — Patient Instructions (Signed)
Read all of the handouts given to you by your recovery room nurse.   Thank-you for choosing [place] for your healthcare needs today.  YOU HAD AN ENDOSCOPIC PROCEDURE TODAY AT [facility]:   Refer to the procedure report that was given to you for any specific questions about what was found during the examination.  If the procedure report does not answer your questions, please call your gastroenterologist to clarify.  If you requested that your care partner not be given the details of your procedure findings, then the procedure report has been included in a sealed envelope for you to review at your convenience later.  YOU SHOULD EXPECT: Some feelings of bloating in the abdomen. Passage of more gas than usual.  Walking can help get rid of the air that was put into your GI tract during the procedure and reduce the bloating. If you had a lower endoscopy (such as a colonoscopy or flexible sigmoidoscopy) you may notice spotting of blood in your stool or on the toilet paper. If you underwent a bowel prep for your procedure, you may not have a normal bowel movement for a few days.  Please Note:  You might notice some irritation and congestion in your nose or some drainage.  This is from the oxygen used during your procedure.  There is no need for concern and it should clear up in a day or so.  SYMPTOMS TO REPORT IMMEDIATELY:   Following lower endoscopy (colonoscopy or flexible sigmoidoscopy):  Excessive amounts of blood in the stool  Significant tenderness or worsening of abdominal pains  Swelling of the abdomen that is new, acute  Fever of 100F or higher     For urgent or emergent issues, a gastroenterologist can be reached at any hour by calling [phone]. Do not use MyChart messaging for urgent concerns.    DIET:  We do recommend a small meal at first, but then you may proceed to your regular diet.  Drink plenty of fluids but you should avoid alcoholic beverages for 24 hours. Try  to increase the fiber in your diet, and drink plenty of water.  ACTIVITY:  You should plan to take it easy for the rest of today and you should NOT DRIVE or use heavy machinery until tomorrow (because of the sedation medicines used during the test).    FOLLOW UP: Our staff will call the number listed on your records 48-72 hours following your procedure to check on you and address any questions or concerns that you may have regarding the information given to you following your procedure. If we do not reach you, we will leave a message.  We will attempt to reach you two times.  During this call, we will ask if you have developed any symptoms of COVID 19. If you develop any symptoms (ie: fever, flu-like symptoms, shortness of breath, cough etc.) before then, please call [phone].  If you test positive for Covid 19 in the 2 weeks post procedure, please call and report this information to [place].    If any biopsies were taken you will be contacted by phone or by letter within the next 1-3 weeks.  Please call us at [phone] if you have not heard about the biopsies in 3 weeks.    SIGNATURES/CONFIDENTIALITY: You and/or your care partner have signed paperwork which will be entered into your electronic medical record.  These signatures attest to the fact that that the information above on your After Visit Summary  has been reviewed and is understood.  Full responsibility of the confidentiality of this discharge information lies with you and/or your care-partner. 

## 2020-04-01 NOTE — Progress Notes (Signed)
Vital signs checked by:MO  The patient states no changes in medical or surgical history since pre-visit screening on 03/18/20.

## 2020-04-01 NOTE — Progress Notes (Signed)
Called to room to assist during endoscopic procedure.  Patient ID and intended procedure confirmed with present staff. Received instructions for my participation in the procedure from the performing physician.  

## 2020-04-02 ENCOUNTER — Telehealth: Payer: Self-pay | Admitting: *Deleted

## 2020-04-02 NOTE — Telephone Encounter (Signed)
°  Follow up Call-  Call back number 04/01/2020  Post procedure Call Back phone  # [phone]  Permission to leave phone message Yes  Some recent data might be hidden     Patient questions:  Do you have a fever, pain , or abdominal swelling? No. Pain Score  0 *  Have you tolerated food without any problems? Yes.    Have you been able to return to your normal activities? Yes.    Do you have any questions about your discharge instructions: Diet   No. Medications  No. Follow up visit  No.  Do you have questions or concerns about your Care? No.  Actions: * If pain score is 4 or above: No action needed, pain <4.  1. Have you developed a fever since your procedure? no  2.   Have you had an respiratory symptoms (SOB or cough) since your procedure? no  3.   Have you tested positive for COVID 19 since your procedure no  4.   Have you had any family members/close contacts diagnosed with the COVID 19 since your procedure?  no   If yes to any of these questions please route to Joylene John, RN and Joella Prince, RN

## 2020-04-10 ENCOUNTER — Encounter: Payer: Self-pay | Admitting: Internal Medicine

## 2020-04-17 ENCOUNTER — Other Ambulatory Visit: Payer: Self-pay | Admitting: Family Medicine

## 2020-04-18 NOTE — Telephone Encounter (Signed)
Ok to refill??  Last office visit 12/28/2019.  Last refill 03/18/2020.

## 2020-05-21 ENCOUNTER — Other Ambulatory Visit: Payer: Self-pay | Admitting: Family Medicine

## 2020-05-22 NOTE — Telephone Encounter (Signed)
Ok to refill??  Last office visit 12/28/2019.  Last refill 04/18/2020.

## 2020-06-08 ENCOUNTER — Encounter (HOSPITAL_COMMUNITY): Payer: Self-pay | Admitting: Cardiovascular Disease

## 2020-06-08 ENCOUNTER — Other Ambulatory Visit: Payer: Self-pay

## 2020-06-08 ENCOUNTER — Inpatient Hospital Stay (HOSPITAL_COMMUNITY)
Admission: EM | Disposition: A | Discharge status: Home / Self Care | Payer: Self-pay | Source: Home / Self Care | Attending: Cardiovascular Disease

## 2020-06-08 ENCOUNTER — Inpatient Hospital Stay (HOSPITAL_COMMUNITY)
Admission: EM | Admit: 2020-06-08 | Discharge: 2020-06-10 | DRG: 247 | Disposition: A | Discharge status: Home / Self Care | Payer: BC Managed Care – PPO | Attending: Cardiovascular Disease | Admitting: Cardiovascular Disease

## 2020-06-08 DIAGNOSIS — G479 Sleep disorder, unspecified: Secondary | ICD-10-CM | POA: Diagnosis present

## 2020-06-08 DIAGNOSIS — R079 Chest pain, unspecified: Secondary | ICD-10-CM | POA: Diagnosis not present

## 2020-06-08 DIAGNOSIS — Z79899 Other long term (current) drug therapy: Secondary | ICD-10-CM | POA: Diagnosis not present

## 2020-06-08 DIAGNOSIS — I213 ST elevation (STEMI) myocardial infarction of unspecified site: Secondary | ICD-10-CM | POA: Diagnosis not present

## 2020-06-08 DIAGNOSIS — Z888 Allergy status to other drugs, medicaments and biological substances status: Secondary | ICD-10-CM | POA: Diagnosis not present

## 2020-06-08 DIAGNOSIS — I2510 Atherosclerotic heart disease of native coronary artery without angina pectoris: Secondary | ICD-10-CM | POA: Diagnosis present

## 2020-06-08 DIAGNOSIS — Z955 Presence of coronary angioplasty implant and graft: Secondary | ICD-10-CM

## 2020-06-08 DIAGNOSIS — K219 Gastro-esophageal reflux disease without esophagitis: Secondary | ICD-10-CM | POA: Diagnosis not present

## 2020-06-08 DIAGNOSIS — E782 Mixed hyperlipidemia: Secondary | ICD-10-CM | POA: Diagnosis not present

## 2020-06-08 DIAGNOSIS — R9431 Abnormal electrocardiogram [ECG] [EKG]: Secondary | ICD-10-CM | POA: Diagnosis not present

## 2020-06-08 DIAGNOSIS — R0789 Other chest pain: Secondary | ICD-10-CM | POA: Diagnosis not present

## 2020-06-08 DIAGNOSIS — Z20822 Contact with and (suspected) exposure to covid-19: Secondary | ICD-10-CM | POA: Diagnosis not present

## 2020-06-08 DIAGNOSIS — F4323 Adjustment disorder with mixed anxiety and depressed mood: Secondary | ICD-10-CM | POA: Diagnosis present

## 2020-06-08 DIAGNOSIS — I214 Non-ST elevation (NSTEMI) myocardial infarction: Secondary | ICD-10-CM | POA: Diagnosis not present

## 2020-06-08 DIAGNOSIS — F17210 Nicotine dependence, cigarettes, uncomplicated: Secondary | ICD-10-CM | POA: Diagnosis present

## 2020-06-08 DIAGNOSIS — Z885 Allergy status to narcotic agent status: Secondary | ICD-10-CM | POA: Diagnosis not present

## 2020-06-08 DIAGNOSIS — Z818 Family history of other mental and behavioral disorders: Secondary | ICD-10-CM | POA: Diagnosis not present

## 2020-06-08 DIAGNOSIS — F17200 Nicotine dependence, unspecified, uncomplicated: Secondary | ICD-10-CM | POA: Diagnosis present

## 2020-06-08 DIAGNOSIS — Z8249 Family history of ischemic heart disease and other diseases of the circulatory system: Secondary | ICD-10-CM | POA: Diagnosis not present

## 2020-06-08 DIAGNOSIS — I2582 Chronic total occlusion of coronary artery: Secondary | ICD-10-CM | POA: Diagnosis present

## 2020-06-08 DIAGNOSIS — E785 Hyperlipidemia, unspecified: Secondary | ICD-10-CM | POA: Diagnosis present

## 2020-06-08 DIAGNOSIS — I2102 ST elevation (STEMI) myocardial infarction involving left anterior descending coronary artery: Secondary | ICD-10-CM | POA: Diagnosis not present

## 2020-06-08 HISTORY — PX: LEFT HEART CATH AND CORONARY ANGIOGRAPHY: CATH118249

## 2020-06-08 HISTORY — PX: CORONARY/GRAFT ACUTE MI REVASCULARIZATION: CATH118305

## 2020-06-08 LAB — CBC
HCT: 43 % (ref 39.0–52.0)
Hemoglobin: 14.9 g/dL (ref 13.0–17.0)
MCH: 33.9 pg (ref 26.0–34.0)
MCHC: 34.7 g/dL (ref 30.0–36.0)
MCV: 97.9 fL (ref 80.0–100.0)
Platelets: 211 10*3/uL (ref 150–400)
RBC: 4.39 MIL/uL (ref 4.22–5.81)
RDW: 13 % (ref 11.5–15.5)
WBC: 9.3 10*3/uL (ref 4.0–10.5)
nRBC: 0 % (ref 0.0–0.2)

## 2020-06-08 LAB — COMPREHENSIVE METABOLIC PANEL
ALT: 35 U/L (ref 0–44)
AST: 36 U/L (ref 15–41)
Albumin: 4.1 g/dL (ref 3.5–5.0)
Alkaline Phosphatase: 54 U/L (ref 38–126)
Anion gap: 13 (ref 5–15)
BUN: 17 mg/dL (ref 6–20)
CO2: 20 mmol/L — ABNORMAL LOW (ref 22–32)
Calcium: 9.9 mg/dL (ref 8.9–10.3)
Chloride: 108 mmol/L (ref 98–111)
Creatinine, Ser: 0.87 mg/dL (ref 0.61–1.24)
GFR, Estimated: >60 mL/min (ref >60)
Glucose, Bld: 130 mg/dL — ABNORMAL HIGH (ref 70–99)
Potassium: 3.9 mmol/L (ref 3.5–5.1)
Sodium: 141 mmol/L (ref 135–145)
Total Bilirubin: 0.7 mg/dL (ref 0.3–1.2)
Total Protein: 6.7 g/dL (ref 6.5–8.1)

## 2020-06-08 LAB — TROPONIN I (HIGH SENSITIVITY)
Troponin I (High Sensitivity): 111 ng/L (ref <18)
Troponin I (High Sensitivity): 9718 ng/L (ref <18)

## 2020-06-08 LAB — POC SARS CORONAVIRUS 2 AG: SARS Coronavirus 2 Ag: NEGATIVE

## 2020-06-08 LAB — TYPE AND SCREEN
ABO/RH(D): O POS
Antibody Screen: NEGATIVE

## 2020-06-08 LAB — HIV ANTIBODY (ROUTINE TESTING W REFLEX): HIV Screen 4th Generation wRfx: NONREACTIVE

## 2020-06-08 LAB — SARS CORONAVIRUS 2 BY RT PCR (HOSPITAL ORDER, PERFORMED IN ~~LOC~~ HOSPITAL LAB): SARS Coronavirus 2: NEGATIVE

## 2020-06-08 LAB — ABO/RH: ABO/RH(D): O POS

## 2020-06-08 SURGERY — CORONARY/GRAFT ACUTE MI REVASCULARIZATION
Anesthesia: LOCAL

## 2020-06-08 MED ORDER — ROSUVASTATIN CALCIUM 20 MG PO TABS
20.0000 mg | ORAL_TABLET | Freq: Every day | ORAL | Status: DC
  Administered 2020-06-08 – 2020-06-09 (×2): 20 mg via ORAL
  Filled 2020-06-08 (×2): qty 1

## 2020-06-08 MED ORDER — NITROGLYCERIN 1 MG/10 ML FOR IR/CATH LAB
INTRA_ARTERIAL | Status: DC | PRN
  Administered 2020-06-08: 150 ug via INTRACORONARY
  Administered 2020-06-08: 100 ug via INTRACORONARY

## 2020-06-08 MED ORDER — ONDANSETRON HCL 4 MG/2ML IJ SOLN: 4.0000 mg | Freq: Four times a day (QID) | INTRAMUSCULAR | Status: DC | PRN

## 2020-06-08 MED ORDER — LIDOCAINE HCL (PF) 1 % IJ SOLN
INTRAMUSCULAR | Status: AC
  Filled 2020-06-08: qty 30

## 2020-06-08 MED ORDER — LIDOCAINE HCL (PF) 1 % IJ SOLN
INTRAMUSCULAR | Status: DC | PRN
  Administered 2020-06-08: 5 mL via SUBCUTANEOUS

## 2020-06-08 MED ORDER — TIROFIBAN HCL IN NACL 5-0.9 MG/100ML-% IV SOLN
INTRAVENOUS | Status: AC
  Filled 2020-06-08: qty 100

## 2020-06-08 MED ORDER — MIDAZOLAM HCL 2 MG/2ML IJ SOLN
INTRAMUSCULAR | Status: AC
  Filled 2020-06-08: qty 2

## 2020-06-08 MED ORDER — FENTANYL CITRATE (PF) 100 MCG/2ML IJ SOLN
INTRAMUSCULAR | Status: AC
  Filled 2020-06-08: qty 2

## 2020-06-08 MED ORDER — NITROGLYCERIN 1 MG/10 ML FOR IR/CATH LAB
INTRA_ARTERIAL | Status: AC
  Filled 2020-06-08: qty 10

## 2020-06-08 MED ORDER — ATORVASTATIN CALCIUM 80 MG PO TABS
80.0000 mg | ORAL_TABLET | Freq: Every day | ORAL | Status: DC
Start: 2020-06-08 — End: 2020-06-08

## 2020-06-08 MED ORDER — VERAPAMIL HCL 2.5 MG/ML IV SOLN
INTRAVENOUS | Status: AC
  Filled 2020-06-08: qty 2

## 2020-06-08 MED ORDER — HEPARIN SODIUM (PORCINE) 1000 UNIT/ML IJ SOLN
INTRAMUSCULAR | Status: AC
  Filled 2020-06-08: qty 1

## 2020-06-08 MED ORDER — NITROGLYCERIN 0.4 MG SL SUBL
0.4000 mg | SUBLINGUAL_TABLET | SUBLINGUAL | Status: DC | PRN
  Administered 2020-06-08: 0.4 mg via SUBLINGUAL

## 2020-06-08 MED ORDER — HEPARIN SODIUM (PORCINE) 1000 UNIT/ML IJ SOLN
4000.0000 [IU] | Freq: Once | INTRAMUSCULAR | Status: AC
  Administered 2020-06-08: 4000 [IU] via INTRAVENOUS

## 2020-06-08 MED ORDER — HEPARIN (PORCINE) IN NACL 1000-0.9 UT/500ML-% IV SOLN
INTRAVENOUS | Status: AC
  Filled 2020-06-08: qty 1500

## 2020-06-08 MED ORDER — ROSUVASTATIN CALCIUM 20 MG PO TABS: 20.0000 mg | ORAL_TABLET | Freq: Every day | ORAL | Status: DC

## 2020-06-08 MED ORDER — SODIUM CHLORIDE 0.9 % WEIGHT BASED INFUSION
1.0000 mL/kg/h | INTRAVENOUS | Status: AC
  Administered 2020-06-08: 1 mL/kg/h via INTRAVENOUS

## 2020-06-08 MED ORDER — IOHEXOL 350 MG/ML SOLN
INTRAVENOUS | Status: DC | PRN
  Administered 2020-06-08: 100 mL via INTRA_ARTERIAL

## 2020-06-08 MED ORDER — HEPARIN (PORCINE) IN NACL 1000-0.9 UT/500ML-% IV SOLN
INTRAVENOUS | Status: DC | PRN
  Administered 2020-06-08 (×2): 500 mL

## 2020-06-08 MED ORDER — FENTANYL CITRATE (PF) 100 MCG/2ML IJ SOLN: 25.0000 ug | INTRAMUSCULAR | Status: DC | PRN

## 2020-06-08 MED ORDER — ACETAMINOPHEN 325 MG PO TABS: 650.0000 mg | ORAL_TABLET | ORAL | Status: DC | PRN

## 2020-06-08 MED ORDER — TICAGRELOR 90 MG PO TABS
90.0000 mg | ORAL_TABLET | Freq: Two times a day (BID) | ORAL | Status: DC
  Administered 2020-06-09 – 2020-06-10 (×3): 90 mg via ORAL
  Filled 2020-06-08 (×3): qty 1

## 2020-06-08 MED ORDER — SODIUM CHLORIDE 0.9 % IV SOLN
INTRAVENOUS | Status: AC | PRN
  Administered 2020-06-08: 10 mL/h via INTRAVENOUS

## 2020-06-08 MED ORDER — LABETALOL HCL 5 MG/ML IV SOLN: 10.0000 mg | INTRAVENOUS | Status: DC | PRN

## 2020-06-08 MED ORDER — SODIUM CHLORIDE 0.9% FLUSH: 3.0000 mL | INTRAVENOUS | Status: DC | PRN

## 2020-06-08 MED ORDER — FENTANYL CITRATE (PF) 100 MCG/2ML IJ SOLN
INTRAMUSCULAR | Status: DC | PRN
  Administered 2020-06-08 (×2): 25 ug via INTRAVENOUS

## 2020-06-08 MED ORDER — HYDRALAZINE HCL 20 MG/ML IJ SOLN: 10.0000 mg | INTRAMUSCULAR | Status: DC | PRN

## 2020-06-08 MED ORDER — VERAPAMIL HCL 2.5 MG/ML IV SOLN
INTRAVENOUS | Status: DC | PRN
  Administered 2020-06-08: 10 mL via INTRA_ARTERIAL

## 2020-06-08 MED ORDER — HEPARIN SODIUM (PORCINE) 1000 UNIT/ML IJ SOLN
INTRAMUSCULAR | Status: DC | PRN
  Administered 2020-06-08: 4000 [IU] via INTRAVENOUS

## 2020-06-08 MED ORDER — IOHEXOL 350 MG/ML SOLN
INTRAVENOUS | Status: AC
  Filled 2020-06-08: qty 1

## 2020-06-08 MED ORDER — SODIUM CHLORIDE 0.9 % IV SOLN: 250.0000 mL | INTRAVENOUS | Status: DC | PRN

## 2020-06-08 MED ORDER — TIROFIBAN HCL IN NACL 5-0.9 MG/100ML-% IV SOLN
INTRAVENOUS | Status: AC | PRN
Start: 2020-06-08 — End: 2020-06-08
  Administered 2020-06-08: 0.15 ug/kg/min via INTRAVENOUS

## 2020-06-08 MED ORDER — SODIUM CHLORIDE 0.9% FLUSH
3.0000 mL | Freq: Two times a day (BID) | INTRAVENOUS | Status: DC
  Administered 2020-06-09 – 2020-06-10 (×3): 3 mL via INTRAVENOUS

## 2020-06-08 MED ORDER — MIDAZOLAM HCL 2 MG/2ML IJ SOLN
INTRAMUSCULAR | Status: DC | PRN
  Administered 2020-06-08: 1 mg via INTRAVENOUS
  Administered 2020-06-08: 2 mg via INTRAVENOUS

## 2020-06-08 MED ORDER — TICAGRELOR 90 MG PO TABS
ORAL_TABLET | ORAL | Status: AC
  Filled 2020-06-08: qty 1

## 2020-06-08 MED ORDER — NICOTINE 21 MG/24HR TD PT24
21.0000 mg | MEDICATED_PATCH | Freq: Every day | TRANSDERMAL | Status: DC
  Administered 2020-06-08 – 2020-06-10 (×3): 21 mg via TRANSDERMAL
  Filled 2020-06-08 (×3): qty 1

## 2020-06-08 MED ORDER — TIROFIBAN (AGGRASTAT) BOLUS VIA INFUSION
INTRAVENOUS | Status: DC | PRN
Start: 2020-06-08 — End: 2020-06-08
  Administered 2020-06-08: 1900 ug via INTRAVENOUS

## 2020-06-08 MED ORDER — TICAGRELOR 90 MG PO TABS
ORAL_TABLET | ORAL | Status: DC | PRN
  Administered 2020-06-08: 180 mg via ORAL

## 2020-06-08 MED ORDER — METOPROLOL TARTRATE 12.5 MG HALF TABLET: 12.5000 mg | ORAL_TABLET | Freq: Two times a day (BID) | ORAL | Status: DC

## 2020-06-08 MED ORDER — ASPIRIN EC 81 MG PO TBEC
81.0000 mg | DELAYED_RELEASE_TABLET | Freq: Every day | ORAL | Status: DC
  Administered 2020-06-09 – 2020-06-10 (×2): 81 mg via ORAL
  Filled 2020-06-08 (×2): qty 1

## 2020-06-08 MED ORDER — NITROGLYCERIN 0.4 MG SL SUBL: 0.4000 mg | SUBLINGUAL_TABLET | SUBLINGUAL | Status: DC | PRN

## 2020-06-08 MED ORDER — DIAZEPAM 5 MG PO TABS
5.0000 mg | ORAL_TABLET | ORAL | Status: DC | PRN
  Administered 2020-06-08 – 2020-06-09 (×2): 5 mg via ORAL
  Filled 2020-06-08 (×2): qty 1

## 2020-06-08 MED ORDER — TIROFIBAN HCL IN NACL 5-0.9 MG/100ML-% IV SOLN
0.1500 ug/kg/min | INTRAVENOUS | Status: AC
  Administered 2020-06-08: 0.15 ug/kg/min via INTRAVENOUS

## 2020-06-08 MED ORDER — TICAGRELOR 90 MG PO TABS: 90.0000 mg | ORAL_TABLET | Freq: Two times a day (BID) | ORAL | Status: DC

## 2020-06-08 SURGICAL SUPPLY — 18 items
BALLN SAPPHIRE 2.5X15 (BALLOONS) ×2
BALLN SAPPHIRE ~~LOC~~ 3.0X15 (BALLOONS) ×2 IMPLANT
BALLOON SAPPHIRE 2.5X15 (BALLOONS) ×1 IMPLANT
CATH 5FR JL3.5 JR4 ANG PIG MP (CATHETERS) ×2 IMPLANT
CATH LAUNCHER 6FR EBU3.5 (CATHETERS) ×2 IMPLANT
DEVICE RAD COMP TR BAND LRG (VASCULAR PRODUCTS) ×2 IMPLANT
GLIDESHEATH SLEND A-KIT 6F 22G (SHEATH) IMPLANT
GLIDESHEATH SLEND SS 6F .021 (SHEATH) ×2 IMPLANT
GUIDEWIRE INQWIRE 1.5J.035X260 (WIRE) ×1 IMPLANT
INQWIRE 1.5J .035X260CM (WIRE) ×2
KIT ENCORE 26 ADVANTAGE (KITS) ×2 IMPLANT
KIT HEART LEFT (KITS) ×2 IMPLANT
PACK CARDIAC CATHETERIZATION (CUSTOM PROCEDURE TRAY) ×2 IMPLANT
STENT RESOLUTE ONYX 2.75X22 (Permanent Stent) ×2 IMPLANT
TRANSDUCER W/STOPCOCK (MISCELLANEOUS) ×2 IMPLANT
TUBING CIL FLEX 10 FLL-RA (TUBING) ×2 IMPLANT
WIRE ASAHI PROWATER 180CM (WIRE) ×2 IMPLANT
WIRE COUGAR XT STRL 190CM (WIRE) ×2 IMPLANT

## 2020-06-08 NOTE — Plan of Care (Signed)
Patient education ongoing.

## 2020-06-08 NOTE — ED Triage Notes (Signed)
Pt presents to ED BIB [facility] from home. Pt presents as CODE STEMI. Pt c/O L CP that radiates to L arm. Pain began 2h ago at rest. Pain is 10/10. EMS given 324 asa, 4mg  morphine, and nitro x1. 18R AC
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
## 2020-06-08 NOTE — ED Notes (Signed)
Benjamine Mola RN escorting pt to cath lab with cards

## 2020-06-09 ENCOUNTER — Inpatient Hospital Stay (HOSPITAL_COMMUNITY): Payer: BC Managed Care – PPO

## 2020-06-09 ENCOUNTER — Encounter (HOSPITAL_COMMUNITY): Payer: Self-pay | Admitting: Cardiovascular Disease

## 2020-06-09 DIAGNOSIS — I2102 ST elevation (STEMI) myocardial infarction involving left anterior descending coronary artery: Secondary | ICD-10-CM | POA: Diagnosis not present

## 2020-06-09 DIAGNOSIS — I213 ST elevation (STEMI) myocardial infarction of unspecified site: Secondary | ICD-10-CM | POA: Diagnosis not present

## 2020-06-09 DIAGNOSIS — R079 Chest pain, unspecified: Secondary | ICD-10-CM

## 2020-06-09 LAB — CBC
HCT: 39.1 % (ref 39.0–52.0)
Hemoglobin: 12.8 g/dL — ABNORMAL LOW (ref 13.0–17.0)
MCH: 32.9 pg (ref 26.0–34.0)
MCHC: 32.7 g/dL (ref 30.0–36.0)
MCV: 100.5 fL — ABNORMAL HIGH (ref 80.0–100.0)
Platelets: 175 10*3/uL (ref 150–400)
RBC: 3.89 MIL/uL — ABNORMAL LOW (ref 4.22–5.81)
RDW: 13.2 % (ref 11.5–15.5)
WBC: 8.7 10*3/uL (ref 4.0–10.5)
nRBC: 0 % (ref 0.0–0.2)

## 2020-06-09 LAB — BASIC METABOLIC PANEL
Anion gap: 9 (ref 5–15)
BUN: 15 mg/dL (ref 6–20)
CO2: 20 mmol/L — ABNORMAL LOW (ref 22–32)
Calcium: 8.5 mg/dL — ABNORMAL LOW (ref 8.9–10.3)
Chloride: 108 mmol/L (ref 98–111)
Creatinine, Ser: 0.72 mg/dL (ref 0.61–1.24)
GFR, Estimated: >60 mL/min (ref >60)
Glucose, Bld: 118 mg/dL — ABNORMAL HIGH (ref 70–99)
Potassium: 3.5 mmol/L (ref 3.5–5.1)
Sodium: 137 mmol/L (ref 135–145)

## 2020-06-09 LAB — LIPID PANEL
Cholesterol: 153 mg/dL (ref 0–200)
HDL: 53 mg/dL (ref >40)
LDL Cholesterol: 86 mg/dL (ref 0–99)
Total CHOL/HDL Ratio: 2.9 RATIO
Triglycerides: 72 mg/dL (ref <150)
VLDL: 14 mg/dL (ref 0–40)

## 2020-06-09 LAB — MAGNESIUM: Magnesium: 1.9 mg/dL (ref 1.7–2.4)

## 2020-06-09 LAB — ECHOCARDIOGRAM COMPLETE
Area-P 1/2: 2.16 cm2
S' Lateral: 3.9 cm
Weight: 2680.79 oz

## 2020-06-09 LAB — POCT ACTIVATED CLOTTING TIME: Activated Clotting Time: 309 seconds

## 2020-06-09 LAB — HEMOGLOBIN A1C
Hgb A1c MFr Bld: 6 % — ABNORMAL HIGH (ref 4.8–5.6)
Mean Plasma Glucose: 125.5 mg/dL

## 2020-06-09 LAB — MRSA PCR SCREENING: MRSA by PCR: NEGATIVE

## 2020-06-09 MED ORDER — POTASSIUM CHLORIDE CRYS ER 20 MEQ PO TBCR
40.0000 meq | EXTENDED_RELEASE_TABLET | Freq: Once | ORAL | Status: AC
  Administered 2020-06-09: 40 meq via ORAL
  Filled 2020-06-09: qty 2

## 2020-06-09 MED ORDER — LISINOPRIL 5 MG PO TABS: 5.0000 mg | ORAL_TABLET | Freq: Every day | ORAL | Status: DC

## 2020-06-09 MED ORDER — MAGNESIUM SULFATE 2 GM/50ML IV SOLN
2.0000 g | Freq: Once | INTRAVENOUS | Status: AC
  Administered 2020-06-09: 2 g via INTRAVENOUS
  Filled 2020-06-09: qty 50

## 2020-06-09 MED ORDER — ALPRAZOLAM 0.5 MG PO TABS
0.5000 mg | ORAL_TABLET | Freq: Every evening | ORAL | Status: DC | PRN
  Administered 2020-06-09: 0.5 mg via ORAL
  Filled 2020-06-09: qty 1

## 2020-06-09 MED ORDER — CHLORHEXIDINE GLUCONATE CLOTH 2 % EX PADS: 6.0000 | MEDICATED_PAD | Freq: Every day | CUTANEOUS | Status: DC

## 2020-06-09 MED FILL — Heparin Sod (Porcine)-NaCl IV Soln 1000 Unit/500ML-0.9%: INTRAVENOUS | Qty: 500 | Status: AC

## 2020-06-09 NOTE — Progress Notes (Signed)
Progress Note  Patient Name: Tony Montoya Date of Encounter: 06/09/2020  [facility] Cardiologist: No primary care provider on file.   Subjective   Patient feels dizzy this morning, feels like he is having some anxiety.  No chest pain or shortness of breath at present.  Dizziness occurs lying supine or sitting upright without a positional component.  Inpatient Medications    Scheduled Meds: . aspirin EC  81 mg Oral Daily  . Chlorhexidine Gluconate Cloth  6 each Topical Daily  . lisinopril  5 mg Oral Daily  . metoprolol tartrate  12.5 mg Oral BID  . nicotine  21 mg Transdermal Daily  . rosuvastatin  20 mg Oral QHS  . sodium chloride flush  3 mL Intravenous Q12H  . ticagrelor  90 mg Oral BID   Continuous Infusions: . sodium chloride     PRN Meds: sodium chloride, acetaminophen, diazepam, fentaNYL (SUBLIMAZE) injection, nitroGLYCERIN, ondansetron (ZOFRAN) IV, sodium chloride flush   Vital Signs    Vitals:   06/09/20 0730 06/09/20 0755 06/09/20 0800 06/09/20 0830  BP: 107/73  102/84 113/67  Pulse: (!) 54  61 60  Resp: 15  16 14   Temp:  98.1 F (36.7 C)    TempSrc:  Oral    SpO2: 98%  97% 95%  Weight:        Intake/Output Summary (Last 24 hours) at 06/09/2020 0910 Last data filed at 06/09/2020 0800 Gross per 24 hour  Intake 1505.4 ml  Output 1075 ml  Net 430.4 ml   Last 3 Weights 06/08/2020 04/01/2020 03/18/2020  Weight (lbs) 167 lb 8.8 oz 165 lb 165 lb  Weight (kg) 76 kg 74.844 kg 74.844 kg      Telemetry    Sinus rhythm, sinus bradycardia, few runs of nonsustained VT/reperfusion arrhythmia - Personally Reviewed  ECG    Sinus bradycardia 59 bpm, first-degree AV block, nonspecific T wave abnormality - Personally Reviewed  Physical Exam  Alert, oriented, no distress. GEN: No acute distress.   Neck: No JVD Cardiac: RRR, no murmurs, rubs, or gallops.  Respiratory: Clear to auscultation bilaterally. GI: Soft, nontender, non-distended  MS: No edema; No  deformity. Neuro:  Nonfocal  Psych: Normal affect   Labs    High Sensitivity Troponin:   Recent Labs  Lab 06/08/20 1910 06/08/20 2041  TROPONINIHS 111* 9,718*      Chemistry Recent Labs  Lab 06/08/20 1910 06/09/20 0041  NA 141 137  K 3.9 3.5  CL 108 108  CO2 20* 20*  GLUCOSE 130* 118*  BUN 17 15  CREATININE 0.87 0.72  CALCIUM 9.9 8.5*  PROT 6.7  --   ALBUMIN 4.1  --   AST 36  --   ALT 35  --   ALKPHOS 54  --   BILITOT 0.7  --   GFRNONAA >60 >60  ANIONGAP 13 9     Hematology Recent Labs  Lab 06/08/20 1910 06/09/20 0041  WBC 9.3 8.7  RBC 4.39 3.89*  HGB 14.9 12.8*  HCT 43.0 39.1  MCV 97.9 100.5*  MCH 33.9 32.9  MCHC 34.7 32.7  RDW 13.0 13.2  PLT 211 175    BNPNo results for input(s): BNP, PROBNP in the last 168 hours.   DDimer No results for input(s): DDIMER in the last 168 hours.   Radiology    CARDIAC CATHETERIZATION  Result Date: 06/08/2020 1.  Severe single-vessel coronary artery disease with total occlusion of the mid LAD, treated successfully with primary PCI  using a 2.75 x 22 mm resolute Onyx DES. 2.  Diffuse plaquing in the RCA and left circumflex without significant stenoses, ectasia of the RCA noted. 3.  Normal LVEDP Recommendations: Post MI medical therapy.  Aggrastat x2 hours.  Aspirin and ticagrelor at least 12 months without interruption.  Aggressive risk reduction measures.  Tobacco cessation counseling.   Cardiac Studies   See above  Patient Profile     60 y.o. male smoker with no prior cardiac history presents with anterior STEMI 06/08/2020  Assessment & Plan    1.  STEMI involving the LAD: As above, patient underwent primary PCI yesterday evening.  He is treated with aspirin and ticagrelor, high intensity statin drug, and a beta-blocker.  2D echo pending to assess LV function.  No clinical signs of acute heart failure, LVEDP was normal during cardiac catheterization procedure.  Troponin trend 111----> 9718.  Considering his  dizziness this morning and low normal blood pressure/heart rate, I am going to hold lisinopril and metoprolol today.  Phase 1 cardiac rehab today. 2.  Mixed hyperlipidemia: Cholesterol 222, LDL 136.  Started on high intensity statin drug.  Has had some issues with statin intolerance in the past.  Trial of rosuvastatin 20 mg.  Disposition: Await 2D echo, continue current medical management, reassess tomorrow morning.  Mobilize today as tolerated.  For questions or updates, please contact [place] Please consult [email or web address] for contact info under        Signed, Sherren Mocha, MD  06/09/2020, 9:10 AM

## 2020-06-09 NOTE — CV Procedure (Signed)
Echo attempted patient not feeling well. Will attempt one patient is feeling better.

## 2020-06-09 NOTE — Progress Notes (Signed)
  Echocardiogram 2D Echocardiogram has been performed.  Tony Montoya 06/09/2020, 12:03 PM

## 2020-06-09 NOTE — Progress Notes (Incomplete)
CC: chest pn HPI: 60 yo M no AC PTA, presents w/ anterior STEMI PMH: HLD, tobacco PTA Meds: atorv20, viagra, alpraz 0.25, naprox prn, vitC ALL:  SH: 1 pk/day Culture:  SAR  Problem 1 - ACS/ STEMI Assessment: EKG confirmed/trop 9718 (peak); heparin 4000 units IV in the ED. cath (1/30)- total occlusion mid LAD = DES. Aggerstat x2 (1/30). Echo attempted-not feeling well (1/30)  - bASA, lopress12.5, brilinta90, rousv20  atorv20 PTA w/ no improvement  HW:YSHU/OH:FGBMSX low/ O2 sat 90's RA  Scr/ Hgb/ Plt stable   I/O's: 631mL/ A1c 6/ BG <180  Plan: brilinta copay $30; counsel pt on meds  - order lipid panel; last 8/21  Problem 2 - Tobacco cessation Assessment:  - Nicoderm patch 21mg  q24h Plan  Best Practice: 1. Valium prn  Resolve:  -Potassium 40 x1 (3.9) & mag replce

## 2020-06-09 NOTE — Progress Notes (Signed)
CARDIAC REHAB PHASE I   PRE:  Rate/Rhythm: 63 SR    BP: sitting 124/76    SaO2: 98 RA  MODE:  Ambulation: 370 ft   POST:  Rate/Rhythm: 71 SR    BP: sitting 129/74     SaO2: 98 RA  Pt feeling better than in am. Able to walk without significant c/o. Did have SOB after sitting. VSS. Discussed MI, stent, restrictions, Brilinta importance, diet, smoking cessation, and CRPII. Pt and wife receptive. They are planning to quit smoking. We also discussed his need to cut down on sweets (eats whole packs of cookies at a time). Will refer to [place] however pt loves to work so he is unsure he will prioritize it. Will f/u tomorrow. [id number]  [place], ACSM 06/09/2020 2:06 PM

## 2020-06-10 ENCOUNTER — Other Ambulatory Visit (HOSPITAL_COMMUNITY): Payer: Self-pay | Admitting: Cardiology

## 2020-06-10 MED ORDER — METOPROLOL TARTRATE 12.5 MG HALF TABLET
12.5000 mg | ORAL_TABLET | Freq: Two times a day (BID) | ORAL | Status: DC
  Administered 2020-06-10: 12.5 mg via ORAL
  Filled 2020-06-10: qty 1

## 2020-06-10 MED ORDER — NITROGLYCERIN 0.4 MG SL SUBL: 0.4000 mg | SUBLINGUAL_TABLET | SUBLINGUAL | 2 refills | Status: DC | PRN

## 2020-06-10 MED ORDER — TICAGRELOR 90 MG PO TABS: 90.0000 mg | ORAL_TABLET | Freq: Two times a day (BID) | ORAL | 2 refills | Status: DC

## 2020-06-10 MED ORDER — ASPIRIN 81 MG PO TBEC: 81.0000 mg | DELAYED_RELEASE_TABLET | Freq: Every day | ORAL | 1 refills | Status: DC

## 2020-06-10 MED ORDER — ROSUVASTATIN CALCIUM 20 MG PO TABS: 20.0000 mg | ORAL_TABLET | Freq: Every day | ORAL | 1 refills | Status: DC

## 2020-06-10 MED ORDER — NICOTINE 21 MG/24HR TD PT24: 21.0000 mg | MEDICATED_PATCH | Freq: Every day | TRANSDERMAL | 0 refills | Status: DC

## 2020-06-10 MED ORDER — METOPROLOL TARTRATE 25 MG PO TABS: 12.5000 mg | ORAL_TABLET | Freq: Two times a day (BID) | ORAL | 1 refills | Status: DC

## 2020-06-10 MED FILL — NICOTINE 21 MG/24HR PATCH: 21 | 28 days supply | Qty: 28 | Fill #0

## 2020-06-10 MED FILL — ASPIRIN LOW DOSE 81 MG TBEC: 81 | 90 days supply | Qty: 90 | Fill #0 | Status: TO

## 2020-06-10 MED FILL — NITROGLYCERIN 0.4 MG TAB SL: 0.4 | 8 days supply | Qty: 25 | Fill #0 | Status: TO

## 2020-06-10 MED FILL — METOPROLOL TARTRATE 25 MG T: 25 | 30 days supply | Qty: 30 | Fill #0 | Status: TO

## 2020-06-10 MED FILL — ROSUVASTATIN CALCIUM 20 MG: 20 | 90 days supply | Qty: 90 | Fill #0 | Status: TO

## 2020-06-10 MED FILL — BRILINTA 90 MG TABLET: 90 | 30 days supply | Qty: 60 | Fill #0 | Status: TO

## 2020-06-10 NOTE — TOC Benefit Eligibility Note (Signed)
Transition of Care [facility]) Benefit Eligibility Note    Patient Details  Name: VALON GLASSCOCK MRN: [id number] Date of Birth: 1960/05/25   Medication/Dose: Kary Kos 90mg  bid  Covered?: Yes     Prescription Coverage Preferred Pharmacy: patient can use any retail pharmacy  Spoke with Person/Company/Phone Number:: [employer]  Co-Pay: $30 for 30 day retail  Prior Approval: No          Delorse Lek Phone Number: 06/10/2020, 1:00 PM

## 2020-06-10 NOTE — Progress Notes (Signed)
Progress Note  Patient Name: Tony Montoya Date of Encounter: 06/10/2020  [facility] Cardiologist: No primary care provider on file.   Subjective   Feeling better today.  No chest pain.  Some shortness of breath noted.  Eager to go home.  No recurrent dizziness.  Inpatient Medications    Scheduled Meds: . aspirin EC  81 mg Oral Daily  . Chlorhexidine Gluconate Cloth  6 each Topical Daily  . nicotine  21 mg Transdermal Daily  . rosuvastatin  20 mg Oral QHS  . sodium chloride flush  3 mL Intravenous Q12H  . ticagrelor  90 mg Oral BID   Continuous Infusions: . sodium chloride     PRN Meds: sodium chloride, acetaminophen, ALPRAZolam, fentaNYL (SUBLIMAZE) injection, nitroGLYCERIN, ondansetron (ZOFRAN) IV, sodium chloride flush   Vital Signs    Vitals:   06/10/20 0500 06/10/20 0600 06/10/20 0700 06/10/20 0725  BP: 90/67 (!) 89/54 100/76   Pulse: 71 70 61   Resp:      Temp:    98.2 F (36.8 C)  TempSrc:    Oral  SpO2: 93% 92% 91%   Weight:        Intake/Output Summary (Last 24 hours) at 06/10/2020 0907 Last data filed at 06/10/2020 0700 Gross per 24 hour  Intake 270 ml  Output 2875 ml  Net -2605 ml   Last 3 Weights 06/08/2020 04/01/2020 03/18/2020  Weight (lbs) 167 lb 8.8 oz 165 lb 165 lb  Weight (kg) 76 kg 74.844 kg 74.844 kg      Telemetry    Sinus rhythm, occasional PVCs- Personally Reviewed   Physical Exam  Alert, oriented, no distress GEN: No acute distress.   Neck: No JVD Cardiac: RRR, no murmurs, rubs, or gallops.  Respiratory: Clear to auscultation bilaterally. GI: Soft, nontender, non-distended  MS: No edema; No deformity.  Right radial site clear Neuro:  Nonfocal  Psych: Normal affect   Labs    High Sensitivity Troponin:   Recent Labs  Lab 06/08/20 1910 06/08/20 2041  TROPONINIHS 111* 9,718*      Chemistry Recent Labs  Lab 06/08/20 1910 06/09/20 0041  NA 141 137  K 3.9 3.5  CL 108 108  CO2 20* 20*  GLUCOSE 130* 118*  BUN 17  15  CREATININE 0.87 0.72  CALCIUM 9.9 8.5*  PROT 6.7  --   ALBUMIN 4.1  --   AST 36  --   ALT 35  --   ALKPHOS 54  --   BILITOT 0.7  --   GFRNONAA >60 >60  ANIONGAP 13 9     Hematology Recent Labs  Lab 06/08/20 1910 06/09/20 0041  WBC 9.3 8.7  RBC 4.39 3.89*  HGB 14.9 12.8*  HCT 43.0 39.1  MCV 97.9 100.5*  MCH 33.9 32.9  MCHC 34.7 32.7  RDW 13.0 13.2  PLT 211 175    BNPNo results for input(s): BNP, PROBNP in the last 168 hours.   DDimer No results for input(s): DDIMER in the last 168 hours.   Radiology    CARDIAC CATHETERIZATION  Result Date: 06/08/2020 1.  Severe single-vessel coronary artery disease with total occlusion of the mid LAD, treated successfully with primary PCI using a 2.75 x 22 mm resolute Onyx DES. 2.  Diffuse plaquing in the RCA and left circumflex without significant stenoses, ectasia of the RCA noted. 3.  Normal LVEDP Recommendations: Post MI medical therapy.  Aggrastat x2 hours.  Aspirin and ticagrelor at least 12 months without  interruption.  Aggressive risk reduction measures.  Tobacco cessation counseling.  ECHOCARDIOGRAM COMPLETE  Result Date: 06/09/2020    ECHOCARDIOGRAM REPORT   Patient Name:   Tony Montoya Date of Exam: 06/09/2020 Medical Rec #:  [id number]       Height:       69.5 in Accession #:    [id number]      Weight:       167.5 lb Date of Birth:  01/12/1961        BSA:          1.926 m Patient Age:    60 years        BP:           103/59 mmHg Patient Gender: M               HR:           58 bpm. Exam Location:  Inpatient Procedure: 2D Echo Indications:    STEMI I21.3  History:        Patient has no prior history of Echocardiogram examinations.                 CAD; Risk Factors:Dyslipidemia and Current Smoker.  Sonographer:    Dustin Flock Referring Phys: [id number] [place]  1. Left ventricular ejection fraction, by estimation, is 35 to 40%. The left ventricle has moderately decreased function. The left ventricle  demonstrates regional wall motion abnormalities (see scoring diagram/findings for description). There is mild concentric left ventricular hypertrophy. Left ventricular diastolic parameters are consistent with Grade I diastolic dysfunction (impaired relaxation). There is akinesis of the left ventricular, apical anterior wall, inferior wall and inferolateral wall.  There is akinesis of the left ventricular, mid anteroseptal wall and inferoseptal wall. There is akinesis of the left ventricular, entire apical segment.  2. Right ventricular systolic function is normal. The right ventricular size is normal. There is normal pulmonary artery systolic pressure. The estimated right ventricular systolic pressure is 0000000 mmHg.  3. The mitral valve is normal in structure. Mild mitral valve regurgitation. No evidence of mitral stenosis.  4. The aortic valve is tricuspid. Aortic valve regurgitation is not visualized. Mild aortic valve sclerosis is present, with no evidence of aortic valve stenosis.  5. The inferior vena cava is normal in size with greater than 50% respiratory variability, suggesting right atrial pressure of 3 mmHg. FINDINGS  Left Ventricle: Left ventricular ejection fraction, by estimation, is 35 to 40%. The left ventricle has moderately decreased function. The left ventricle demonstrates regional wall motion abnormalities. The left ventricular internal cavity size was normal in size. There is mild concentric left ventricular hypertrophy. Left ventricular diastolic parameters are consistent with Grade I diastolic dysfunction (impaired relaxation). Normal left ventricular filling pressure. Right Ventricle: The right ventricular size is normal. No increase in right ventricular wall thickness. Right ventricular systolic function is normal. There is normal pulmonary artery systolic pressure. The tricuspid regurgitant velocity is 2.06 m/s, and  with an assumed right atrial pressure of 3 mmHg, the estimated right  ventricular systolic pressure is 0000000 mmHg. Left Atrium: Left atrial size was normal in size. Right Atrium: Right atrial size was normal in size. Pericardium: There is no evidence of pericardial effusion. Mitral Valve: The mitral valve is normal in structure. There is mild calcification of the mitral valve leaflet(s). Mild mitral annular calcification. Mild mitral valve regurgitation. No evidence of mitral valve stenosis. Tricuspid Valve: The tricuspid valve is normal in structure. Tricuspid valve  regurgitation is trivial. No evidence of tricuspid stenosis. Aortic Valve: The aortic valve is tricuspid. Aortic valve regurgitation is not visualized. Mild aortic valve sclerosis is present, with no evidence of aortic valve stenosis. Pulmonic Valve: The pulmonic valve was normal in structure. Pulmonic valve regurgitation is not visualized. No evidence of pulmonic stenosis. Aorta: The aortic root is normal in size and structure. Venous: The inferior vena cava is normal in size with greater than 50% respiratory variability, suggesting right atrial pressure of 3 mmHg. IAS/Shunts: The interatrial septum appears to be lipomatous. No atrial level shunt detected by color flow Doppler.  LEFT VENTRICLE PLAX 2D LVIDd:         5.30 cm  Diastology LVIDs:         3.90 cm  LV e' medial:    4.57 cm/s LV PW:         1.20 cm  LV E/e' medial:  11.9 LV IVS:        1.20 cm  LV e' lateral:   9.14 cm/s LVOT diam:     2.30 cm  LV E/e' lateral: 6.0 LV SV:         73 LV SV Index:   38 LVOT Area:     4.15 cm  RIGHT VENTRICLE RV Basal diam:  3.20 cm RV S prime:     15.00 cm/s TAPSE (M-mode): 2.3 cm LEFT ATRIUM             Index       RIGHT ATRIUM           Index LA diam:        3.80 cm 1.97 cm/m  RA Area:     13.30 cm LA Vol (A2C):   43.2 ml 22.43 ml/m RA Volume:   33.50 ml  17.40 ml/m LA Vol (A4C):   33.4 ml 17.34 ml/m LA Biplane Vol: 40.0 ml 20.77 ml/m  AORTIC VALVE LVOT Vmax:   86.80 cm/s LVOT Vmean:  53.400 cm/s LVOT VTI:    0.176 m   AORTA Ao Root diam: 3.50 cm MITRAL VALVE               TRICUSPID VALVE MV Area (PHT): 2.16 cm    TR Peak grad:   17.0 mmHg MV Decel Time: 352 msec    TR Vmax:        206.00 cm/s MV E velocity: 54.40 cm/s MV A velocity: 59.10 cm/s  SHUNTS MV E/A ratio:  0.92        Systemic VTI:  0.18 m                            Systemic Diam: 2.30 cm Fransico Him MD Electronically signed by Fransico Him MD Signature Date/Time: 06/09/2020/12:33:52 PM    Final     Cardiac Studies   2D echo reviewed as above  Patient Profile     60 y.o. male smoker with no prior cardiac history presents with anterior STEMI secondary to LAD occlusion 06/08/2020  Assessment & Plan    1.  STEMI involving the LAD: 2D echo reviewed demonstrating moderate segmental LV dysfunction with LVEF 35 to 40%, anteroapical akinesis noted.  Lisinopril metoprolol were held yesterday because of borderline blood pressure.  The patient did well with cardiac rehab this morning.  He is on aspirin and ticagrelor as well as a high intensity statin drug.  Blood pressure remains soft.  We will try him on  12.5 of metoprolol twice daily, but I do not think he will tolerate an ACE inhibitor or any higher doses of beta-blocker at this point. 2.  Mixed hyperlipidemia: Lipids as outlined in yesterday's note.  Started on rosuvastatin 20 mg because of previous statin intolerance we are avoiding the highest dose. 3.  Tobacco abuse: Cessation counseling done.  Patient motivated to quit.  Disposition: Medically stable for hospital discharge today.  Post MI instructions and restrictions reviewed with the patient.  Advised that he stay out of work x2 weeks considering the physical nature of his job.  Anticipate repeat echocardiogram in 3 months.  Will need transition of care visit in 1 to 2 weeks.  Will check with case manager for Brilinta coverage.  For questions or updates, please contact [place] Please consult [email or web address] for contact info under         Signed, Sherren Mocha, MD  06/10/2020, 9:07 AM

## 2020-06-10 NOTE — Progress Notes (Signed)
CARDIAC REHAB PHASE I   PRE:  Rate/Rhythm: 70 SR    BP: sitting 110/70    SaO2:   MODE:  Ambulation: 740 ft   POST:  Rate/Rhythm: 78 SR    BP: sitting 122/72     SaO2:   Tolerated well, no SOB today with increased distance. Discussed low sodium diet (given EF), exercise guidelines, and NTG with pt and wife. Good reception.  [place], ACSM 06/10/2020 9:08 AM

## 2020-06-10 NOTE — Progress Notes (Addendum)
Benefits check is in process for Brilinta 90 mg twice a day. Whitman Hero RN,BSN,CM [phone]  06/10/2020 @ 1200 NCM spoke with pt and wife @ bedside regarding Brilinta. NCM provide pt with a Brilinta copay card for refills, pt can potentially get refills as low as $5.00 ( commercial payor/[employer]BS). Awaiting benefits check  for Brilinta... NCM to call wife with results if not back before pt discharges. [place] (Spouse)     [phone]

## 2020-06-10 NOTE — Discharge Summary (Signed)
Discharge Summary    Patient ID: Tony Montoya MRN: [id number]; DOB: January 04, 1961  Admit date: 06/08/2020 Discharge date: 06/10/2020  Primary Care Provider: Susy Frizzle, MD  Primary Cardiologist: Sherren Mocha, MD  Primary Electrophysiologist:  None   Discharge Diagnoses    Principal Problem:   STEMI (ST elevation myocardial infarction) [facility]) Active Problems:   Hyperlipidemia   Smoker  Diagnostic Studies/Procedures    Cath: 06/08/20  1.  Severe single-vessel coronary artery disease with total occlusion of the mid LAD, treated successfully with primary PCI using a 2.75 x 22 mm resolute Onyx DES. 2.  Diffuse plaquing in the RCA and left circumflex without significant stenoses, ectasia of the RCA noted. 3.  Normal LVEDP  Recommendations: Post MI medical therapy.  Aggrastat x2 hours.  Aspirin and ticagrelor at least 12 months without interruption.  Aggressive risk reduction measures.  Tobacco cessation counseling.  Diagnostic Dominance: Right    Intervention      Echo: 06/09/20  IMPRESSIONS    1. Left ventricular ejection fraction, by estimation, is 35 to 40%. The  left ventricle has moderately decreased function. The left ventricle  demonstrates regional wall motion abnormalities (see scoring  diagram/findings for description). There is mild  concentric left ventricular hypertrophy. Left ventricular diastolic  parameters are consistent with Grade I diastolic dysfunction (impaired  relaxation). There is akinesis of the left ventricular, apical anterior  wall, inferior wall and inferolateral wall.  There is akinesis of the left ventricular, mid anteroseptal wall and  inferoseptal wall. There is akinesis of the left ventricular, entire  apical segment.  2. Right ventricular systolic function is normal. The right ventricular  size is normal. There is normal pulmonary artery systolic pressure. The  estimated right ventricular systolic pressure is 0000000 mmHg.   3. The mitral valve is normal in structure. Mild mitral valve  regurgitation. No evidence of mitral stenosis.  4. The aortic valve is tricuspid. Aortic valve regurgitation is not  visualized. Mild aortic valve sclerosis is present, with no evidence of  aortic valve stenosis.  5. The inferior vena cava is normal in size with greater than 50%  respiratory variability, suggesting right atrial pressure of 3 mmHg.  _____________   History of Present Illness     Tony Montoya is a 60 y.o. male with HLD and tobacco use who presented to the ED on 1/30 with chest pain. He was at his mother 90th birthday party where he experienced acute onset L arm pain followed by progressively worsening central chest pain that eventually was 10/10 severity and radiating to his L arm. He initially thought it was anxiety and took half a xanax with no relief. His wife was with him and eventually went home however by the time he got back to their house around 1815 she said he looked significantly worse and she thought he was having a heart attack and called 911. On EMS arrival the rhythm strip had large tombstone STE in V2-V4 at which point he was transferred for code STEMI. On arrival to the ED still was in 9/10 chest pain. He received morphine 4 mg IV, SLNG 0.4 mg x1 dose, and ASA 324 mg PO on transport. He was given heparin 4000 units IV in the ED and taken for emergent coronary angiography.   Hospital Course   1. STEMI: Underwent cardiac cath noted above with severe single vessel disease with total occlusion of the mLAD with PCI/DESx1. Diffuse plaque in the RCA and Lcx without significant disease.  hsTn 9718. Normal LVEDP. Placed on DAPT with ASA/Brilinta for at least one year. No recurrent chest pain. Worked well with cardiac rehab.  -- started on 12.5 of metoprolol twice daily prior to discharge. Unable to add ACE/ARB 2/2 soft blood pressures  2.  Mixed hyperlipidemia: Cholesterol 222, LDL 136 -- Started on  rosuvastatin 20 mg because of previous statin intolerance  -- FLP/LFTs in 8 weeks  3.  Tobacco abuse: Cessation counseling done.  -- Patient motivated to quit.  Did the patient have an acute coronary syndrome (MI, NSTEMI, STEMI, etc) this admission?:  Yes                               AHA/ACC Clinical Performance & Quality Measures: 1. Aspirin prescribed? - Yes 2. ADP Receptor Inhibitor (Plavix/Clopidogrel, Brilinta/Ticagrelor or Effient/Prasugrel) prescribed (includes medically managed patients)? - Yes 3. Beta Blocker prescribed? - Yes 4. High Intensity Statin (Lipitor 40-80mg  or Crestor 20-40mg ) prescribed? - Yes 5. EF assessed during THIS hospitalization? - Yes 6. For EF <40%, was ACEI/ARB prescribed? - Not Applicable (EF >/= 37%) 7. For EF <40%, Aldosterone Antagonist (Spironolactone or Eplerenone) prescribed? - Not Applicable (EF >/= 16%) 8. Cardiac Rehab Phase II ordered (including medically managed patients)? - Yes  _____________  Discharge Vitals Blood pressure 122/72, pulse 79, temperature 98.2 F (36.8 C), temperature source Oral, resp. rate 18, weight 76 kg, SpO2 91 %.  Filed Weights   06/08/20 2028  Weight: 76 kg    Labs & Radiologic Studies    CBC Recent Labs    06/08/20 1910 06/09/20 0041  WBC 9.3 8.7  HGB 14.9 12.8*  HCT 43.0 39.1  MCV 97.9 100.5*  PLT 211 967   Basic Metabolic Panel Recent Labs    06/08/20 1910 06/09/20 0041  NA 141 137  K 3.9 3.5  CL 108 108  CO2 20* 20*  GLUCOSE 130* 118*  BUN 17 15  CREATININE 0.87 0.72  CALCIUM 9.9 8.5*  MG  --  1.9   Liver Function Tests Recent Labs    06/08/20 1910  AST 36  ALT 35  ALKPHOS 54  BILITOT 0.7  PROT 6.7  ALBUMIN 4.1   No results for input(s): LIPASE, AMYLASE in the last 72 hours. High Sensitivity Troponin:   Recent Labs  Lab 06/08/20 1910 06/08/20 2041  TROPONINIHS 111* 9,718*    BNP Invalid input(s): POCBNP D-Dimer No results for input(s): DDIMER in the last 72  hours. Hemoglobin A1C Recent Labs    06/09/20 0041  HGBA1C 6.0*   Fasting Lipid Panel Recent Labs    06/09/20 1159  CHOL 153  HDL 53  LDLCALC 86  TRIG 72  CHOLHDL 2.9   Thyroid Function Tests No results for input(s): TSH, T4TOTAL, T3FREE, THYROIDAB in the last 72 hours.  Invalid input(s): FREET3 _____________  CARDIAC CATHETERIZATION  Result Date: 06/08/2020 1.  Severe single-vessel coronary artery disease with total occlusion of the mid LAD, treated successfully with primary PCI using a 2.75 x 22 mm resolute Onyx DES. 2.  Diffuse plaquing in the RCA and left circumflex without significant stenoses, ectasia of the RCA noted. 3.  Normal LVEDP Recommendations: Post MI medical therapy.  Aggrastat x2 hours.  Aspirin and ticagrelor at least 12 months without interruption.  Aggressive risk reduction measures.  Tobacco cessation counseling.  ECHOCARDIOGRAM COMPLETE  Result Date: 06/09/2020    ECHOCARDIOGRAM REPORT   Patient Name:   Tony A  Montoya Date of Exam: 06/09/2020 Medical Rec #:  [id number]       Height:       69.5 in Accession #:    [id number]      Weight:       167.5 lb Date of Birth:  20-Mar-1961        BSA:          1.926 m Patient Age:    71 years        BP:           103/59 mmHg Patient Gender: M               HR:           58 bpm. Exam Location:  Inpatient Procedure: 2D Echo Indications:    STEMI I21.3  History:        Patient has no prior history of Echocardiogram examinations.                 CAD; Risk Factors:Dyslipidemia and Current Smoker.  Sonographer:    Dustin Flock Referring Phys: [id number] [facility]  1. Left ventricular ejection fraction, by estimation, is 35 to 40%. The left ventricle has moderately decreased function. The left ventricle demonstrates regional wall motion abnormalities (see scoring diagram/findings for description). There is mild concentric left ventricular hypertrophy. Left ventricular diastolic parameters are consistent with Grade I  diastolic dysfunction (impaired relaxation). There is akinesis of the left ventricular, apical anterior wall, inferior wall and inferolateral wall.  There is akinesis of the left ventricular, mid anteroseptal wall and inferoseptal wall. There is akinesis of the left ventricular, entire apical segment.  2. Right ventricular systolic function is normal. The right ventricular size is normal. There is normal pulmonary artery systolic pressure. The estimated right ventricular systolic pressure is 0000000 mmHg.  3. The mitral valve is normal in structure. Mild mitral valve regurgitation. No evidence of mitral stenosis.  4. The aortic valve is tricuspid. Aortic valve regurgitation is not visualized. Mild aortic valve sclerosis is present, with no evidence of aortic valve stenosis.  5. The inferior vena cava is normal in size with greater than 50% respiratory variability, suggesting right atrial pressure of 3 mmHg. FINDINGS  Left Ventricle: Left ventricular ejection fraction, by estimation, is 35 to 40%. The left ventricle has moderately decreased function. The left ventricle demonstrates regional wall motion abnormalities. The left ventricular internal cavity size was normal in size. There is mild concentric left ventricular hypertrophy. Left ventricular diastolic parameters are consistent with Grade I diastolic dysfunction (impaired relaxation). Normal left ventricular filling pressure. Right Ventricle: The right ventricular size is normal. No increase in right ventricular wall thickness. Right ventricular systolic function is normal. There is normal pulmonary artery systolic pressure. The tricuspid regurgitant velocity is 2.06 m/s, and  with an assumed right atrial pressure of 3 mmHg, the estimated right ventricular systolic pressure is 0000000 mmHg. Left Atrium: Left atrial size was normal in size. Right Atrium: Right atrial size was normal in size. Pericardium: There is no evidence of pericardial effusion. Mitral Valve: The  mitral valve is normal in structure. There is mild calcification of the mitral valve leaflet(s). Mild mitral annular calcification. Mild mitral valve regurgitation. No evidence of mitral valve stenosis. Tricuspid Valve: The tricuspid valve is normal in structure. Tricuspid valve regurgitation is trivial. No evidence of tricuspid stenosis. Aortic Valve: The aortic valve is tricuspid. Aortic valve regurgitation is not visualized. Mild aortic valve sclerosis is present, with no evidence  of aortic valve stenosis. Pulmonic Valve: The pulmonic valve was normal in structure. Pulmonic valve regurgitation is not visualized. No evidence of pulmonic stenosis. Aorta: The aortic root is normal in size and structure. Venous: The inferior vena cava is normal in size with greater than 50% respiratory variability, suggesting right atrial pressure of 3 mmHg. IAS/Shunts: The interatrial septum appears to be lipomatous. No atrial level shunt detected by color flow Doppler.  LEFT VENTRICLE PLAX 2D LVIDd:         5.30 cm  Diastology LVIDs:         3.90 cm  LV e' medial:    4.57 cm/s LV PW:         1.20 cm  LV E/e' medial:  11.9 LV IVS:        1.20 cm  LV e' lateral:   9.14 cm/s LVOT diam:     2.30 cm  LV E/e' lateral: 6.0 LV SV:         73 LV SV Index:   38 LVOT Area:     4.15 cm  RIGHT VENTRICLE RV Basal diam:  3.20 cm RV S prime:     15.00 cm/s TAPSE (M-mode): 2.3 cm LEFT ATRIUM             Index       RIGHT ATRIUM           Index LA diam:        3.80 cm 1.97 cm/m  RA Area:     13.30 cm LA Vol (A2C):   43.2 ml 22.43 ml/m RA Volume:   33.50 ml  17.40 ml/m LA Vol (A4C):   33.4 ml 17.34 ml/m LA Biplane Vol: 40.0 ml 20.77 ml/m  AORTIC VALVE LVOT Vmax:   86.80 cm/s LVOT Vmean:  53.400 cm/s LVOT VTI:    0.176 m  AORTA Ao Root diam: 3.50 cm MITRAL VALVE               TRICUSPID VALVE MV Area (PHT): 2.16 cm    TR Peak grad:   17.0 mmHg MV Decel Time: 352 msec    TR Vmax:        206.00 cm/s MV E velocity: 54.40 cm/s MV A velocity: 59.10  cm/s  SHUNTS MV E/A ratio:  0.92        Systemic VTI:  0.18 m                            Systemic Diam: 2.30 cm Armanda Magicraci Turner MD Electronically signed by Armanda Magicraci Turner MD Signature Date/Time: 06/09/2020/12:33:52 PM    Final    Disposition   Pt is being discharged home today in good condition.  Follow-up Plans & Appointments     Follow-up Information    [place]hagat, Sharrell KuBhavinkumar, [place] Follow up on 06/20/2020.   Specialty: Cardiology Why: at 2:15pm for your follow up appt Contact information: [address] [place] [address] [phone]              Discharge Instructions    Amb Referral to Cardiac Rehabilitation   Complete by: As directed    Diagnosis:  Coronary Stents STEMI PTCA     After initial evaluation and assessments completed: Virtual Based Care may be provided alone or in conjunction with Phase 2 Cardiac Rehab based on patient barriers.: Yes   Diet - low sodium heart healthy   Complete by: As directed  Discharge instructions   Complete by: As directed    Radial Site Care Refer to this sheet in the next few weeks. These instructions provide you with information on caring for yourself after your procedure. Your caregiver may also give you more specific instructions. Your treatment has been planned according to current medical practices, but problems sometimes occur. Call your caregiver if you have any problems or questions after your procedure. HOME CARE INSTRUCTIONS You may shower the day after the procedure.Remove the bandage (dressing) and gently wash the site with plain soap and water.Gently pat the site dry.  Do not apply powder or lotion to the site.  Do not submerge the affected site in water for 3 to 5 days.  Inspect the site at least twice daily.  Do not flex or bend the affected arm for 24 hours.  No lifting over 5 pounds (2.3 kg) for 5 days after your procedure.  Do not drive home if you are discharged the same day of the procedure. Have someone  else drive you.  You may drive 24 hours after the procedure unless otherwise instructed by your caregiver.  What to expect: Any bruising will usually fade within 1 to 2 weeks.  Blood that collects in the tissue (hematoma) may be painful to the touch. It should usually decrease in size and tenderness within 1 to 2 weeks.  SEEK IMMEDIATE MEDICAL CARE IF: You have unusual pain at the radial site.  You have redness, warmth, swelling, or pain at the radial site.  You have drainage (other than a small amount of blood on the dressing).  You have chills.  You have a fever or persistent symptoms for more than 72 hours.  You have a fever and your symptoms suddenly get worse.  Your arm becomes pale, cool, tingly, or numb.  You have heavy bleeding from the site. Hold pressure on the site.   PLEASE DO NOT MISS ANY DOSES OF YOUR BRILINTA!!!!! Also keep a log of you blood pressures and bring back to your follow up appt. Please call the office with any questions.   Patients taking blood thinners should generally stay away from medicines like ibuprofen, Advil, Motrin, naproxen, and Aleve due to risk of stomach bleeding. You may take Tylenol as directed or talk to your primary doctor about alternatives.  PLEASE ENSURE THAT YOU DO NOT RUN OUT OF YOUR BRILINTA. This medication is very important to remain on for at least one year. IF you have issues obtaining this medication due to cost please CALL the office 3-5 business days prior to running out in order to prevent missing doses of this medication.   Increase activity slowly   Complete by: As directed    No wound care   Complete by: As directed       Discharge Medications   Allergies as of 06/10/2020      Reactions   Bupropion Hcl Other (See Comments)   REACTION: Jittery and couldn't get focus   Codeine Other (See Comments)   Makes me nervous    Simvastatin Other (See Comments)   Feet cramps   Zolpidem Tartrate Other (See Comments)   REACTION:  amnesia and sleep walking      Medication List    STOP taking these medications   atorvastatin 20 MG tablet Commonly known as: LIPITOR   naproxen sodium 220 MG tablet Commonly known as: ALEVE     TAKE these medications   ALPRAZolam 0.5 MG tablet Commonly known as: Duanne Moron  TAKE 1 TABLET BY MOUTH THREE TIMES A DAY AS NEEDED FOR ANXIETY What changed: See the new instructions.   aspirin 81 MG EC tablet Take 1 tablet (81 mg total) by mouth daily. Swallow whole. Start taking on: June 11, 2020   metoprolol tartrate 25 MG tablet Commonly known as: LOPRESSOR Take 0.5 tablets (12.5 mg total) by mouth 2 (two) times daily.   nicotine 21 mg/24hr patch Commonly known as: NICODERM CQ - dosed in mg/24 hours Place 1 patch (21 mg total) onto the skin daily. Start taking on: June 11, 2020   nitroGLYCERIN 0.4 MG SL tablet Commonly known as: NITROSTAT Place 1 tablet (0.4 mg total) under the tongue every 5 (five) minutes x 3 doses as needed for chest pain.   rosuvastatin 20 MG tablet Commonly known as: CRESTOR Take 1 tablet (20 mg total) by mouth at bedtime.   sildenafil 100 MG tablet Commonly known as: Viagra Take 0.5-1 tablets (50-100 mg total) by mouth daily as needed for erectile dysfunction.   ticagrelor 90 MG Tabs tablet Commonly known as: BRILINTA Take 1 tablet (90 mg total) by mouth 2 (two) times daily.   vitamin C 500 MG tablet Commonly known as: ASCORBIC ACID Take 500 mg by mouth daily.       Outstanding Labs/Studies   FLP/LFTs in 8 weeks  Duration of Discharge Encounter   Greater than 30 minutes including physician time.  Signed, Reino Bellis, NP 06/10/2020, 12:12 PM

## 2020-06-10 NOTE — Progress Notes (Signed)
Pt given D/C papers, all questions answered. Follow up discussed, medication discussed. Pt and wife both verbalized understanding.

## 2020-06-11 ENCOUNTER — Telehealth (HOSPITAL_COMMUNITY): Payer: Self-pay

## 2020-06-11 ENCOUNTER — Telehealth: Payer: Self-pay | Admitting: *Deleted

## 2020-06-11 DIAGNOSIS — Z0279 Encounter for issue of other medical certificate: Secondary | ICD-10-CM

## 2020-06-11 NOTE — Telephone Encounter (Signed)
Pharmacy Transitions of Care Follow-up Telephone Call  Date of discharge: 06/10/20 Discharge Diagnosis: stent placement  How have you been since you were released from the hospital? Patient is feeling well. Wife is helping take care of him and he hasn't smoked since discharge. Pt is aware of s/sx of bleeding to watch for.  Medication changes made at discharge: yes  Medication changes obtained and verified? yes    Medication Accessibility:  Home Pharmacy: [facility]  Was the patient provided with refills on discharged medications? yes  Have all prescriptions been transferred from [facility] to home pharmacy? yes  . Is the patient able to afford medications? yes    Medication Review:  TICAGRELOR (BRILINTA) Ticagrelor 90 mg BID initiated on 06/10/20.  - Educated patient on expected duration of therapy of aspirin with ticagrelor. Aspirin will be continued indefinitely/discontinued. - Discussed importance of taking medication around the same time every day, - Advised patient of medications to avoid (NSAIDs, aspirin maintenance doses>100 mg daily) - Educated that Tylenol (acetaminophen) will be the preferred analgesic to prevent risk of bleeding  - Emphasized importance of monitoring for signs and symptoms of bleeding (abnormal bruising, prolonged bleeding, nose bleeds, bleeding from gums, discolored urine, black tarry stools)  - Educated patient to notify doctor if shortness of breath or abnormal heartbeat occur  Follow-up Appointments:  Follow up scheduled with Tony Montoya in Cardiology on 06/20/20  If their condition worsens, is the pt aware to call PCP or go to the Emergency Dept.? yes  Final Patient Assessment: Patient is well, has refills and follow ups scheduled, knows s/sx of bleeding

## 2020-06-11 NOTE — Telephone Encounter (Signed)
Transition Care Management Follow-up Telephone Call  Date of discharge and from where: 06/10/2020 - [facility]  How have you been since you were released from the hospital? "I am okay, kind of tired."  Any questions or concerns? No  Items Reviewed:  Did the pt receive and understand the discharge instructions provided? Yes   Medications obtained and verified? Yes   Other? N/A  Any new allergies since your discharge? No   Dietary orders reviewed? Yes  Do you have support at home? Yes   Home Care and Equipment/Supplies: Were home health services ordered? not applicable If so, what is the name of the agency? N/A  Has the agency set up a time to come to the patient's home? not applicable Were any new equipment or medical supplies ordered?  No What is the name of the medical supply agency? N/A Were you able to get the supplies/equipment? not applicable Do you have any questions related to the use of the equipment or supplies? No  Functional Questionnaire: (I = Independent and D = Dependent) ADLs: I  Bathing/Dressing- I  Meal Prep- I  Eating- I  Maintaining continence- I  Transferring/Ambulation- I  Managing Meds- I  Follow up appointments reviewed:   PCP Hospital f/u appt confirmed? [facility] f/u appt confirmed? Yes  Scheduled to see Cardiology on 06/20/2020 @ 1415.  Are transportation arrangements needed? No   If their condition worsens, is the pt aware to call PCP or go to the Emergency Dept.? Yes  Was the patient provided with contact information for the PCP's office or ED? Yes  Was to pt encouraged to call back with questions or concerns? Yes

## 2020-06-12 ENCOUNTER — Telehealth (HOSPITAL_COMMUNITY): Payer: Self-pay

## 2020-06-12 ENCOUNTER — Telehealth (HOSPITAL_COMMUNITY): Payer: Self-pay | Admitting: Family Medicine

## 2020-06-12 NOTE — Telephone Encounter (Signed)
Called and spoke with pt in regards to CR, pt stated he is not able to participate at this time due to his work schedule.   Closed referral 

## 2020-06-13 ENCOUNTER — Telehealth: Payer: Self-pay | Admitting: Cardiovascular Disease

## 2020-06-13 NOTE — Telephone Encounter (Signed)
[facility] received a form from patient/[employer]. Placed in Dr. Antionette Char box for completion.  06/13/20  KLM

## 2020-06-20 ENCOUNTER — Ambulatory Visit: Payer: BC Managed Care – PPO | Admitting: Physician Assistant

## 2020-06-20 ENCOUNTER — Encounter: Payer: Self-pay | Admitting: Physician Assistant

## 2020-06-20 ENCOUNTER — Other Ambulatory Visit: Payer: Self-pay

## 2020-06-20 ENCOUNTER — Other Ambulatory Visit: Payer: Self-pay | Admitting: Family Medicine

## 2020-06-20 VITALS — BP 106/72 | HR 62 | Ht 71.0 in | Wt 166.8 lb

## 2020-06-20 DIAGNOSIS — I255 Ischemic cardiomyopathy: Secondary | ICD-10-CM | POA: Diagnosis not present

## 2020-06-20 DIAGNOSIS — I2510 Atherosclerotic heart disease of native coronary artery without angina pectoris: Secondary | ICD-10-CM | POA: Diagnosis not present

## 2020-06-20 DIAGNOSIS — I2102 ST elevation (STEMI) myocardial infarction involving left anterior descending coronary artery: Secondary | ICD-10-CM

## 2020-06-20 DIAGNOSIS — Z720 Tobacco use: Secondary | ICD-10-CM

## 2020-06-20 DIAGNOSIS — E785 Hyperlipidemia, unspecified: Secondary | ICD-10-CM | POA: Diagnosis not present

## 2020-06-20 MED ORDER — METOPROLOL SUCCINATE ER 50 MG PO TB24: 25.0000 mg | ORAL_TABLET | Freq: Every day | ORAL | 3 refills | Status: DC

## 2020-06-20 MED ORDER — TICAGRELOR 90 MG PO TABS: 90.0000 mg | ORAL_TABLET | Freq: Two times a day (BID) | ORAL | 3 refills | Status: DC

## 2020-06-20 MED ORDER — ROSUVASTATIN CALCIUM 20 MG PO TABS: 20.0000 mg | ORAL_TABLET | Freq: Every day | ORAL | 3 refills | Status: DC

## 2020-06-20 MED ORDER — METOPROLOL SUCCINATE ER 25 MG PO TB24
25.0000 mg | ORAL_TABLET | Freq: Every day | ORAL | 3 refills | Status: DC
Start: 2020-06-20 — End: 2021-05-12
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
## 2020-06-20 NOTE — Patient Instructions (Signed)
Medication Instructions:  1. Stop the metoprolol tartrate 2. Start metoprolol succinate 25 mg, take one tablet by mouth daily *If you need a refill on your cardiac medications before your next appointment, please call your pharmacy*   Lab Work: Lipids and Lft's in 6 weeks If you have labs (blood work) drawn today and your tests are completely normal, you will receive your results only by: Marland Kitchen MyChart Message (if you have MyChart) OR . A paper copy in the mail If you have any lab test that is abnormal or we need to change your treatment, we will call you to review the results.   Testing/Procedures: None   Follow-Up: At [facility], you and your health needs are our priority.  As part of our continuing mission to provide you with exceptional heart care, we have created designated Provider Care Teams.  These Care Teams include your primary Cardiologist (physician) and Advanced Practice Providers (APPs -  Physician Assistants and Nurse Practitioners) who all work together to provide you with the care you need, when you need it.  We recommend signing up for the patient portal called "MyChart".  Sign up information is provided on this After Visit Summary.  MyChart is used to connect with patients for Virtual Visits (Telemedicine).  Patients are able to view lab/test results, encounter notes, upcoming appointments, etc.  Non-urgent messages can be sent to your provider as well.   To learn more about what you can do with MyChart, go to [email or web address].    Your next appointment:   3-4 month(s)  The format for your next appointment:   In Person  Provider:   Sherren Mocha, MD

## 2020-06-24 ENCOUNTER — Encounter: Payer: Self-pay | Admitting: Physician Assistant

## 2020-07-02 DIAGNOSIS — Z79899 Other long term (current) drug therapy: Secondary | ICD-10-CM

## 2020-07-07 MED ORDER — LISINOPRIL 2.5 MG PO TABS: 2.5000 mg | ORAL_TABLET | Freq: Every day | ORAL | 3 refills | Status: DC

## 2020-07-07 NOTE — Telephone Encounter (Signed)
Received form back from Dr. Burt Knack 06/25/20 Emailed a copy to the patient. KLM

## 2020-07-11 ENCOUNTER — Other Ambulatory Visit: Payer: Self-pay | Admitting: Family Medicine

## 2020-08-01 ENCOUNTER — Other Ambulatory Visit: Payer: BC Managed Care – PPO | Admitting: *Deleted

## 2020-08-01 ENCOUNTER — Other Ambulatory Visit: Payer: Self-pay

## 2020-08-01 DIAGNOSIS — I2102 ST elevation (STEMI) myocardial infarction involving left anterior descending coronary artery: Secondary | ICD-10-CM

## 2020-08-01 DIAGNOSIS — E785 Hyperlipidemia, unspecified: Secondary | ICD-10-CM | POA: Diagnosis not present

## 2020-08-01 DIAGNOSIS — Z79899 Other long term (current) drug therapy: Secondary | ICD-10-CM | POA: Diagnosis not present

## 2020-08-01 LAB — BASIC METABOLIC PANEL
BUN/Creatinine Ratio: 23 — ABNORMAL HIGH (ref 9–20)
BUN: 19 mg/dL (ref 6–24)
CO2: 21 mmol/L (ref 20–29)
Calcium: 9.5 mg/dL (ref 8.7–10.2)
Chloride: 103 mmol/L (ref 96–106)
Creatinine, Ser: 0.83 mg/dL (ref 0.76–1.27)
Glucose: 117 mg/dL — ABNORMAL HIGH (ref 65–99)
Potassium: 4.3 mmol/L (ref 3.5–5.2)
Sodium: 139 mmol/L (ref 134–144)
eGFR: 101 mL/min/{1.73_m2} (ref >59)

## 2020-08-01 LAB — LIPID PANEL
Chol/HDL Ratio: 2.5 ratio (ref 0.0–5.0)
Cholesterol, Total: 140 mg/dL (ref 100–199)
HDL: 57 mg/dL (ref >39)
LDL Chol Calc (NIH): 71 mg/dL (ref 0–99)
Triglycerides: 59 mg/dL (ref 0–149)
VLDL Cholesterol Cal: 12 mg/dL (ref 5–40)

## 2020-08-01 LAB — HEPATIC FUNCTION PANEL
ALT: 38 IU/L (ref 0–44)
AST: 30 IU/L (ref 0–40)
Albumin: 4.4 g/dL (ref 3.8–4.9)
Alkaline Phosphatase: 66 IU/L (ref 44–121)
Bilirubin Total: 0.9 mg/dL (ref 0.0–1.2)
Bilirubin, Direct: 0.26 mg/dL (ref 0.00–0.40)
Total Protein: 6.8 g/dL (ref 6.0–8.5)

## 2020-08-01 NOTE — Progress Notes (Signed)
Pt has been made aware of normal result and verbalized understanding.  jw

## 2020-08-13 ENCOUNTER — Other Ambulatory Visit: Payer: Self-pay | Admitting: *Deleted

## 2020-08-13 NOTE — Telephone Encounter (Signed)
Received call from patient.   Requested refill on Xanax.   Ok to refill??  Last office visit 01/01/2020.  Last refill 07/14/2020.  Patient has follow up appointment on 08/19/2020.  Ok to add refills to prescription?

## 2020-08-14 MED ORDER — ALPRAZOLAM 0.5 MG PO TABS: ORAL_TABLET | ORAL | 3 refills | Status: DC

## 2020-08-19 ENCOUNTER — Other Ambulatory Visit: Payer: Self-pay

## 2020-08-19 ENCOUNTER — Encounter: Payer: Self-pay | Admitting: Family Medicine

## 2020-08-19 ENCOUNTER — Ambulatory Visit (INDEPENDENT_AMBULATORY_CARE_PROVIDER_SITE_OTHER): Payer: BC Managed Care – PPO | Admitting: Family Medicine

## 2020-08-19 VITALS — BP 110/68 | HR 56 | Temp 98.3°F | Resp 14 | Ht 71.0 in | Wt 164.0 lb

## 2020-08-19 DIAGNOSIS — I255 Ischemic cardiomyopathy: Secondary | ICD-10-CM | POA: Diagnosis not present

## 2020-08-19 DIAGNOSIS — I2583 Coronary atherosclerosis due to lipid rich plaque: Secondary | ICD-10-CM | POA: Diagnosis not present

## 2020-08-19 DIAGNOSIS — I2510 Atherosclerotic heart disease of native coronary artery without angina pectoris: Secondary | ICD-10-CM

## 2020-08-19 DIAGNOSIS — E785 Hyperlipidemia, unspecified: Secondary | ICD-10-CM

## 2020-08-19 MED ORDER — ALPRAZOLAM 0.5 MG PO TABS: 0.5000 mg | ORAL_TABLET | Freq: Every evening | ORAL | 3 refills | Status: DC | PRN
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
## 2020-09-09 DIAGNOSIS — I2102 ST elevation (STEMI) myocardial infarction involving left anterior descending coronary artery: Secondary | ICD-10-CM

## 2020-09-16 ENCOUNTER — Other Ambulatory Visit: Payer: Self-pay

## 2020-09-16 ENCOUNTER — Encounter: Payer: Self-pay | Admitting: Cardiovascular Disease

## 2020-09-16 ENCOUNTER — Ambulatory Visit (HOSPITAL_COMMUNITY): Payer: BC Managed Care – PPO | Attending: Cardiology

## 2020-09-16 ENCOUNTER — Ambulatory Visit: Payer: BC Managed Care – PPO | Admitting: Cardiovascular Disease

## 2020-09-16 VITALS — BP 114/66 | HR 56 | Ht 71.0 in | Wt 169.0 lb

## 2020-09-16 DIAGNOSIS — E782 Mixed hyperlipidemia: Secondary | ICD-10-CM | POA: Diagnosis not present

## 2020-09-16 DIAGNOSIS — I255 Ischemic cardiomyopathy: Secondary | ICD-10-CM | POA: Diagnosis not present

## 2020-09-16 DIAGNOSIS — I2510 Atherosclerotic heart disease of native coronary artery without angina pectoris: Secondary | ICD-10-CM

## 2020-09-16 DIAGNOSIS — I2102 ST elevation (STEMI) myocardial infarction involving left anterior descending coronary artery: Secondary | ICD-10-CM | POA: Insufficient documentation

## 2020-09-16 LAB — ECHOCARDIOGRAM COMPLETE
Area-P 1/2: 1.7 cm2
S' Lateral: 3.5 cm

## 2020-09-16 NOTE — Patient Instructions (Addendum)
Medication Instructions:  Your provider recommends that you continue on your current medications as directed. Please refer to the Current Medication list given to you today.   *If you need a refill on your cardiac medications before your next appointment, please call your pharmacy*   Follow-Up: At [facility], you and your health needs are our priority.  As part of our continuing mission to provide you with exceptional heart care, we have created designated Provider Care Teams.  These Care Teams include your primary Cardiologist (physician) and Advanced Practice Providers (APPs -  Physician Assistants and Nurse Practitioners) who all work together to provide you with the care you need, when you need it. Your next appointment:   8-9 month(s) The format for your next appointment:   In Person Provider:   You may see Sherren Mocha, MD or one of the following Advanced Practice Providers on your designated Care Team:    Richardson Dopp, PA-C  [place], [place]
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
## 2020-10-10 ENCOUNTER — Other Ambulatory Visit: Payer: Self-pay | Admitting: Cardiology

## 2020-12-04 ENCOUNTER — Other Ambulatory Visit: Payer: Self-pay | Admitting: Family Medicine

## 2020-12-05 ENCOUNTER — Telehealth: Payer: Self-pay

## 2020-12-05 NOTE — Telephone Encounter (Signed)
Pt called in requesting a refill of ALPRAZolam (XANAX) 0.5 MG tablet sent to pharmacy. Pt is hoping to refill asap, pt states that he will be going out of town.  Cb#; [phone]

## 2020-12-05 NOTE — Telephone Encounter (Signed)
Ok to refill??  Last office visit/ refill 08/19/2020, #3 refills.

## 2020-12-05 NOTE — Telephone Encounter (Signed)
Medication was filled on 12/05/2020.

## 2021-02-04 ENCOUNTER — Telehealth: Payer: Self-pay | Admitting: *Deleted

## 2021-02-04 NOTE — Telephone Encounter (Signed)
   [facility] Pre-operative Risk Assessment    Patient Name: Tony Montoya  DOB: 23-Mar-1961 MRN: [id number]  HEARTCARE STAFF:  - IMPORTANT!!!!!! Under Visit Info/Reason for Call, type in Other and utilize the format Clearance MM/DD/YY or Clearance TBD. Do not use dashes or single digits. - Please review there is not already an duplicate clearance open for this procedure. - If request is for dental extraction, please clarify the # of teeth to be extracted. - If the patient is currently at the dentist's office, call Pre-Op Callback Staff (MA/nurse) to input urgent request.  - If the patient is not currently in the dentist office, please route to the Pre-Op pool.  Request for surgical clearance:  What type of surgery is being performed? EXTRACTION; PER PT'S MY CHART MESSAGE SEEMS TO BE 1 TOOTH; OUR OFFICE JUST RECEIVED THIS CLEARANCE REQUEST THIS MORNING FROM DENTAL OFFICE   When is this surgery scheduled? 02/04/21  What type of clearance is required (medical clearance vs. Pharmacy clearance to hold med vs. Both)? BOTH  Are there any medications that need to be held prior to surgery and how long? [place] name and name of physician performing surgery? [facility]; DR. Mina Marble  What is the office phone number? [phone]   7.   What is the office fax number? [phone]; CLEARANCE FORM HAS 2 FAX #; OTHER ONE IS [phone]  8.   Anesthesia type (None, local, MAC, general) ? NOT LISTED    Julaine Hua 02/04/2021, 8:16 AM  _________________________________________________________________   (provider comments below)

## 2021-02-04 NOTE — Telephone Encounter (Signed)
   Patient Name: Tony Montoya  DOB: 1960/11/05 MRN: [id number]  Primary Cardiologist: Sherren Mocha, MD  Chart reviewed as part of pre-operative protocol coverage.   Simple dental extractions are considered low risk procedures per guidelines and generally do not require any specific cardiac clearance. It is also generally accepted that for simple extractions and dental cleanings, there is no need to interrupt blood thinner therapy.   SBE prophylaxis is not required for the patient from a cardiac standpoint.  I will route this recommendation to the requesting party via Epic fax function and remove from pre-op pool.  Please call with questions.  Abigail Butts, PA-C 02/04/2021, 8:45 AM

## 2021-02-24 DIAGNOSIS — M722 Plantar fascial fibromatosis: Secondary | ICD-10-CM | POA: Diagnosis not present

## 2021-02-24 DIAGNOSIS — S90851A Superficial foreign body, right foot, initial encounter: Secondary | ICD-10-CM | POA: Diagnosis not present

## 2021-02-26 ENCOUNTER — Other Ambulatory Visit: Payer: Self-pay

## 2021-02-26 ENCOUNTER — Ambulatory Visit: Payer: BC Managed Care – PPO

## 2021-02-26 ENCOUNTER — Ambulatory Visit (INDEPENDENT_AMBULATORY_CARE_PROVIDER_SITE_OTHER): Payer: BC Managed Care – PPO | Admitting: Podiatry

## 2021-02-26 DIAGNOSIS — M79671 Pain in right foot: Secondary | ICD-10-CM | POA: Diagnosis not present

## 2021-02-26 DIAGNOSIS — L84 Corns and callosities: Secondary | ICD-10-CM

## 2021-02-26 DIAGNOSIS — D2371 Other benign neoplasm of skin of right lower limb, including hip: Secondary | ICD-10-CM

## 2021-02-26 DIAGNOSIS — M722 Plantar fascial fibromatosis: Secondary | ICD-10-CM
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
## 2021-03-15 ENCOUNTER — Encounter: Payer: Self-pay | Admitting: Podiatry

## 2021-03-16 ENCOUNTER — Other Ambulatory Visit: Payer: Self-pay

## 2021-03-16 ENCOUNTER — Ambulatory Visit (INDEPENDENT_AMBULATORY_CARE_PROVIDER_SITE_OTHER): Payer: BC Managed Care – PPO | Admitting: Podiatry

## 2021-03-16 DIAGNOSIS — L02611 Cutaneous abscess of right foot: Secondary | ICD-10-CM | POA: Diagnosis not present

## 2021-03-16 DIAGNOSIS — B353 Tinea pedis: Secondary | ICD-10-CM

## 2021-03-16 MED ORDER — GENTAMICIN SULFATE 0.1 % EX CREA: 1.0000 "application " | TOPICAL_CREAM | Freq: Two times a day (BID) | CUTANEOUS | 0 refills | Status: AC

## 2021-03-16 MED ORDER — TERBINAFINE HCL 250 MG PO TABS: 250.0000 mg | ORAL_TABLET | Freq: Every day | ORAL | 0 refills | Status: AC

## 2021-03-16 NOTE — Patient Instructions (Signed)

## 2021-03-16 NOTE — Telephone Encounter (Signed)
Patient is being seen today(03/16/21)

## 2021-03-17 NOTE — Progress Notes (Signed)
  Subjective:  Patient ID: Tony Montoya, male    DOB: [birth year],  MRN: [id number]  Chief Complaint  Patient presents with   Tinea Pedis    Right foot pinky toe turning green ( in between toes)& swelling.     60 y.o. male presents with the above complaint. History confirmed with patient.  This is a same toe that he had treated a few weeks ago he developed green drainage pain and swelling  Objective:  Physical Exam: warm, good capillary refill, no trophic changes or ulcerative lesions, normal DP and PT pulses, and normal sensory exam. Left Foot: normal exam, no swelling, tenderness, instability; ligaments intact, full range of motion of all ankle/foot joints Right Foot: Tinea pedis with interdigital maceration green discoloration of the skin in the fifth interspace a small area of erythema dorsal to this   Assessment:   1. Tinea pedis of right foot   2. Abscess of right foot      Plan:  Patient was evaluated and treated and all questions answered.  Suspect he has a tinea pedis with a bacterial superinfection.  I put him on 4 weeks of terbinafine as well as gentamicin cream to apply daily.  Advised to clean shoes with antifungal spray.  Follow-up in 4 weeks for reevaluation  Return in about 4 weeks (around 04/13/2021) for recheck athlete's foot and abscess.

## 2021-03-25 ENCOUNTER — Other Ambulatory Visit: Payer: Self-pay | Admitting: Family Medicine

## 2021-03-26 NOTE — Telephone Encounter (Signed)
LOV 08/19/20 Last refill 12/05/20, #30, 3 refills  Please review, thanks!

## 2021-04-13 ENCOUNTER — Ambulatory Visit (INDEPENDENT_AMBULATORY_CARE_PROVIDER_SITE_OTHER): Payer: BC Managed Care – PPO | Admitting: Podiatry

## 2021-04-13 ENCOUNTER — Other Ambulatory Visit: Payer: Self-pay

## 2021-04-13 DIAGNOSIS — B353 Tinea pedis: Secondary | ICD-10-CM

## 2021-04-13 DIAGNOSIS — L84 Corns and callosities: Secondary | ICD-10-CM | POA: Diagnosis not present

## 2021-04-13 NOTE — Progress Notes (Signed)
  Subjective:  Patient ID: Tony Montoya, male    DOB: 1961/05/03,  MRN: [id number]  Chief Complaint  Patient presents with   Tinea Pedis    for recheck athlete's foot and abscess    60 y.o. male returns for follow-up with the above complaint. History confirmed with patient.  Has improved somewhat but is still painful  Objective:  Physical Exam: warm, good capillary refill, no trophic changes or ulcerative lesions, normal DP and PT pulses, and normal sensory exam. Left Foot: normal exam, no swelling, tenderness, instability; ligaments intact, full range of motion of all ankle/foot joints Right Foot: Green discoloration has resolved erythema resolved, he still has a small soft corn between the toes   Assessment:   1. Tinea pedis of right foot   2. Heloma molle       Plan:  Patient was evaluated and treated and all questions answered.  Improved quite a bit.  I discussed with him I would recommend we discontinue the cream at this point and begin to dry out the area using Betadine ointment and keeping gauze or a cotton ball between the toes to keep airflow.  Hopeful this will help resolve it finally.  We also discussed the option of partial syndactylization to alleviate it as well.  If this is necessary would want to wait until after January when he is off his antiplatelet drugs and is over a year out from his last heart attack.  Return in about 1 month (around 05/14/2021) for recheck athlete's foot .

## 2021-05-01 ENCOUNTER — Ambulatory Visit (HOSPITAL_BASED_OUTPATIENT_CLINIC_OR_DEPARTMENT_OTHER): Payer: BC Managed Care – PPO | Admitting: Family

## 2021-05-06 ENCOUNTER — Telehealth: Payer: Self-pay | Admitting: Cardiovascular Disease

## 2021-05-06 NOTE — Telephone Encounter (Signed)
Pt c/o Shortness Of Breath: STAT if SOB developed within the last 24 hours or pt is noticeably SOB on the phone  1. Are you currently SOB (can you hear that pt is SOB on the phone)? Yes   2. How long have you been experiencing SOB? 3 weeks   3. Are you SOB when sitting or when up moving around? Moving around   4. Are you currently experiencing any other symptoms? no

## 2021-05-06 NOTE — Telephone Encounter (Signed)
Spoke with pt who  complains of increasing SOB and fatigue on exertion x 3 weeks.  Pt denies chest pain, palpitations or edema.  He states his BP and HR has been normal.  He has not checked BP or HR today.  He reports he is taking medications as prescribed.  He has his one year STEMI f/u 05/29/20 with Dr Burt Knack Pt advised will forward to Dr Burt Knack for review and recommendation.  Reviewed ED precautions.  Pt verbalizes understanding and agrees with current plan.

## 2021-05-06 NOTE — Telephone Encounter (Signed)
Patient was returning call 

## 2021-05-06 NOTE — Telephone Encounter (Signed)
Left message to call back  

## 2021-05-06 NOTE — Telephone Encounter (Signed)
Ok to add him on to my schedule this Friday at 1:20pm thx

## 2021-05-07 NOTE — Telephone Encounter (Signed)
Spoke with pt and advised Dr Burt Knack would like to see pt 05/08/2021 at 120pm.  Pt verbalizes understanding and thanked Therapist, sports for the call.

## 2021-05-08 ENCOUNTER — Other Ambulatory Visit: Payer: Self-pay

## 2021-05-08 ENCOUNTER — Ambulatory Visit (INDEPENDENT_AMBULATORY_CARE_PROVIDER_SITE_OTHER): Payer: BC Managed Care – PPO | Admitting: Cardiovascular Disease

## 2021-05-08 ENCOUNTER — Encounter: Payer: Self-pay | Admitting: Cardiovascular Disease

## 2021-05-08 VITALS — BP 108/54 | HR 61 | Ht 71.0 in | Wt 166.0 lb

## 2021-05-08 DIAGNOSIS — Z79899 Other long term (current) drug therapy: Secondary | ICD-10-CM | POA: Diagnosis not present

## 2021-05-08 DIAGNOSIS — E782 Mixed hyperlipidemia: Secondary | ICD-10-CM | POA: Diagnosis not present

## 2021-05-08 DIAGNOSIS — R0602 Shortness of breath: Secondary | ICD-10-CM

## 2021-05-08 DIAGNOSIS — Z720 Tobacco use: Secondary | ICD-10-CM

## 2021-05-08 DIAGNOSIS — M79604 Pain in right leg: Secondary | ICD-10-CM

## 2021-05-08 DIAGNOSIS — M79605 Pain in left leg: Secondary | ICD-10-CM

## 2021-05-08 DIAGNOSIS — I2510 Atherosclerotic heart disease of native coronary artery without angina pectoris: Secondary | ICD-10-CM
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
## 2021-05-08 NOTE — Patient Instructions (Signed)
Medication Instructions:  STOP brilinta June 09 2021. STOP lisinopril now. *If you need a refill on your cardiac medications before your next appointment, please call your pharmacy*   Lab Work: CMET, CBC, LIPID PANEL, CK LEVEL If you have labs (blood work) drawn today and your tests are completely normal, you will receive your results only by: [place] (if you have MyChart) OR A paper copy in the mail If you have any lab test that is abnormal or we need to change your treatment, we will call you to review the results.   Testing/Procedures: Your physician has requested that you have a lower extremity arterial exercise with ABI duplex. During this test, exercise and ultrasound are used to evaluate arterial blood flow in the legs. Allow one hour for this exam. There are no restrictions or special instructions.  Chest X-ray Instructions:    1. You may have this done at the [facility], located in the [place] on the 1st floor.    2. You do no have to have an appointment.    3. [place], [place] [address]        [phone]        Monday - Friday  8:00 am - 5:00 pm    Follow-Up: At [facility], you and your health needs are our priority.  As part of our continuing mission to provide you with exceptional heart care, we have created designated Provider Care Teams.  These Care Teams include your primary Cardiologist (physician) and Advanced Practice Providers (APPs -  Physician Assistants and Nurse Practitioners) who all work together to provide you with the care you need, when you need it.  We recommend signing up for the patient portal called "MyChart".  Sign up information is provided on this After Visit Summary.  MyChart is used to connect with patients for Virtual Visits (Telemedicine).  Patients are able to view lab/test results, encounter notes, upcoming appointments, etc.  Non-urgent messages can be sent to your provider  as well.   To learn more about what you can do with MyChart, go to [email or web address].    Your next appointment:   1 year(s)  The format for your next appointment:   In Person  Provider:   Sherren Mocha, MD

## 2021-05-09 LAB — COMPREHENSIVE METABOLIC PANEL
ALT: 44 IU/L (ref 0–44)
AST: 40 IU/L (ref 0–40)
Albumin/Globulin Ratio: 2.3 — ABNORMAL HIGH (ref 1.2–2.2)
Albumin: 4.4 g/dL (ref 3.8–4.9)
Alkaline Phosphatase: 61 IU/L (ref 44–121)
BUN/Creatinine Ratio: 21 — ABNORMAL HIGH (ref 9–20)
BUN: 17 mg/dL (ref 6–24)
Bilirubin Total: 0.4 mg/dL (ref 0.0–1.2)
CO2: 20 mmol/L (ref 20–29)
Calcium: 8.7 mg/dL (ref 8.7–10.2)
Chloride: 105 mmol/L (ref 96–106)
Creatinine, Ser: 0.81 mg/dL (ref 0.76–1.27)
Globulin, Total: 1.9 g/dL (ref 1.5–4.5)
Glucose: 106 mg/dL — ABNORMAL HIGH (ref 70–99)
Potassium: 4 mmol/L (ref 3.5–5.2)
Sodium: 140 mmol/L (ref 134–144)
Total Protein: 6.3 g/dL (ref 6.0–8.5)
eGFR: 102 mL/min/{1.73_m2} (ref >59)

## 2021-05-09 LAB — CBC
Hematocrit: 40.5 % (ref 37.5–51.0)
Hemoglobin: 13.8 g/dL (ref 13.0–17.7)
MCH: 32.8 pg (ref 26.6–33.0)
MCHC: 34.1 g/dL (ref 31.5–35.7)
MCV: 96 fL (ref 79–97)
Platelets: 161 10*3/uL (ref 150–450)
RBC: 4.21 x10E6/uL (ref 4.14–5.80)
RDW: 12.1 % (ref 11.6–15.4)
WBC: 4.3 10*3/uL (ref 3.4–10.8)

## 2021-05-09 LAB — CK: Total CK: 538 U/L (ref 41–331)

## 2021-05-09 LAB — LIPID PANEL
Chol/HDL Ratio: 2.2 ratio (ref 0.0–5.0)
Cholesterol, Total: 119 mg/dL (ref 100–199)
HDL: 55 mg/dL (ref >39)
LDL Chol Calc (NIH): 50 mg/dL (ref 0–99)
Triglycerides: 63 mg/dL (ref 0–149)
VLDL Cholesterol Cal: 14 mg/dL (ref 5–40)

## 2021-05-10 ENCOUNTER — Other Ambulatory Visit: Payer: Self-pay | Admitting: Cardiology

## 2021-05-10 ENCOUNTER — Other Ambulatory Visit: Payer: Self-pay | Admitting: Physician Assistant

## 2021-05-14 ENCOUNTER — Telehealth: Payer: Self-pay | Admitting: *Deleted

## 2021-05-14 ENCOUNTER — Other Ambulatory Visit: Payer: Self-pay | Admitting: Podiatry

## 2021-05-14 DIAGNOSIS — R748 Abnormal levels of other serum enzymes: Secondary | ICD-10-CM

## 2021-05-14 NOTE — Telephone Encounter (Signed)
Spoke with pt and reviewed lab results.  Pt will come for repeat CK on 06/12/21.  Pt verbalized understanding and was in agreement with plan.

## 2021-05-14 NOTE — Telephone Encounter (Signed)
-----   Message from Sherren Mocha, MD sent at 05/14/2021  5:54 AM EST ----- Lipids/LFT's are excellent. However, CK is elevated. With his leg symptoms, I am concerned about statin-myopathy. Recommend hold rosuvastatin, repeat CK in one month, follow leg symptoms. If improvement and symptoms resolve, will need to consider alternative lipid-lowering Rx.

## 2021-05-14 NOTE — Telephone Encounter (Signed)
Please advise 

## 2021-05-19 ENCOUNTER — Ambulatory Visit
Admission: RE | Admit: 2021-05-19 | Discharge: 2021-05-19 | Disposition: A | Discharge status: Home / Self Care | Payer: BC Managed Care – PPO | Source: Ambulatory Visit | Attending: Cardiovascular Disease | Admitting: Cardiovascular Disease

## 2021-05-19 ENCOUNTER — Encounter: Payer: Self-pay | Admitting: Podiatry

## 2021-05-19 ENCOUNTER — Other Ambulatory Visit: Payer: Self-pay | Admitting: Cardiovascular Disease

## 2021-05-19 ENCOUNTER — Ambulatory Visit: Payer: BC Managed Care – PPO | Admitting: Podiatry

## 2021-05-19 ENCOUNTER — Other Ambulatory Visit: Payer: Self-pay

## 2021-05-19 DIAGNOSIS — I739 Peripheral vascular disease, unspecified: Secondary | ICD-10-CM

## 2021-05-19 DIAGNOSIS — I700 Atherosclerosis of aorta: Secondary | ICD-10-CM | POA: Diagnosis not present

## 2021-05-19 DIAGNOSIS — L02611 Cutaneous abscess of right foot: Secondary | ICD-10-CM

## 2021-05-19 DIAGNOSIS — R0602 Shortness of breath: Secondary | ICD-10-CM | POA: Diagnosis not present

## 2021-05-19 DIAGNOSIS — I2510 Atherosclerotic heart disease of native coronary artery without angina pectoris: Secondary | ICD-10-CM

## 2021-05-19 DIAGNOSIS — E782 Mixed hyperlipidemia: Secondary | ICD-10-CM

## 2021-05-19 DIAGNOSIS — M79604 Pain in right leg: Secondary | ICD-10-CM

## 2021-05-19 DIAGNOSIS — M79605 Pain in left leg: Secondary | ICD-10-CM

## 2021-05-19 NOTE — Patient Instructions (Signed)
Call [facility] at [phone] to schedule your [place]

## 2021-05-20 NOTE — Progress Notes (Signed)
°  Subjective:  Patient ID: Tony Montoya, male    DOB: January 18, 1961,  MRN: [id number]  Chief Complaint  Patient presents with   Tinea Pedis    F/U Rt tinea pedis -pt states," about the same, it drained the other day in between toes." - no pain - some redness and swelling Tx; none - D/C betadine     61 y.o. male returns for follow-up with the above complaint. History confirmed with patient.  Continues to be about the same since last visit he has intermittent drainage noted in the socks  Objective:  Physical Exam: warm, good capillary refill, no trophic changes or ulcerative lesions, normal DP and PT pulses, and normal sensory exam. Left Foot: normal exam, no swelling, tenderness, instability; ligaments intact, full range of motion of all ankle/foot joints Right Foot: No green discoloration a soft corn is still present between the toes and there is still a hard tender area dorsal to this and has some serous drainage   Assessment:   1. Abscess of right foot       Plan:  Patient was evaluated and treated and all questions answered.  Has not fully resolved.  He continues to have a soft corn.  We discussed excision of this with partial syndactylization.  I do think he initially still could have an abscess deep to this.  Prior to any surgical intervention for this I recommend we evaluate with an ultrasound which I have ordered from [place].  This been going on for several weeks and has not improved.  Ultrasound should help with guidance for any procedures to improve this  Return for after ultrasound to review.

## 2021-05-29 ENCOUNTER — Ambulatory Visit: Payer: BC Managed Care – PPO | Admitting: Cardiovascular Disease

## 2021-05-29 ENCOUNTER — Other Ambulatory Visit: Payer: Self-pay

## 2021-05-29 ENCOUNTER — Ambulatory Visit (HOSPITAL_COMMUNITY)
Admission: RE | Admit: 2021-05-29 | Discharge: 2021-05-29 | Disposition: A | Discharge status: Home / Self Care | Payer: BC Managed Care – PPO | Source: Ambulatory Visit | Attending: Cardiology | Admitting: Cardiology

## 2021-05-29 DIAGNOSIS — E782 Mixed hyperlipidemia: Secondary | ICD-10-CM | POA: Insufficient documentation

## 2021-05-29 DIAGNOSIS — I739 Peripheral vascular disease, unspecified: Secondary | ICD-10-CM | POA: Insufficient documentation

## 2021-05-29 DIAGNOSIS — I2510 Atherosclerotic heart disease of native coronary artery without angina pectoris: Secondary | ICD-10-CM

## 2021-05-29 DIAGNOSIS — M79604 Pain in right leg: Secondary | ICD-10-CM | POA: Insufficient documentation

## 2021-05-29 DIAGNOSIS — M79605 Pain in left leg: Secondary | ICD-10-CM | POA: Insufficient documentation

## 2021-06-11 ENCOUNTER — Encounter: Payer: Self-pay | Admitting: Cardiovascular Disease

## 2021-06-12 ENCOUNTER — Other Ambulatory Visit: Payer: Self-pay

## 2021-06-12 ENCOUNTER — Telehealth: Payer: Self-pay

## 2021-06-12 ENCOUNTER — Encounter: Payer: Self-pay | Admitting: Podiatry

## 2021-06-12 ENCOUNTER — Other Ambulatory Visit: Payer: BC Managed Care – PPO | Admitting: *Deleted

## 2021-06-12 DIAGNOSIS — R748 Abnormal levels of other serum enzymes: Secondary | ICD-10-CM | POA: Diagnosis not present

## 2021-06-12 LAB — CK: Total CK: 381 U/L — ABNORMAL HIGH (ref 41–331)

## 2021-06-12 MED ORDER — METOPROLOL SUCCINATE ER 25 MG PO TB24: 12.5000 mg | ORAL_TABLET | Freq: Every day | ORAL | 3 refills | Status: DC

## 2021-06-12 NOTE — Telephone Encounter (Signed)
Pt walked in with questions regarding his medications.  Gave Pt updated med list.  He continues to hold rosuvastatin pending repeat lab work.  He is concerned that he is having some fatigue (metoprolol?).    Advised would discuss with Dr. Burt Knack.

## 2021-06-12 NOTE — Telephone Encounter (Signed)
Yes I think this is a good idea to decrease his beta-blocker as suggested.  Thank you

## 2021-06-17 ENCOUNTER — Telehealth: Payer: Self-pay

## 2021-06-17 ENCOUNTER — Other Ambulatory Visit: Payer: Self-pay | Admitting: Physician Assistant

## 2021-06-17 ENCOUNTER — Telehealth: Payer: Self-pay | Admitting: *Deleted

## 2021-06-17 DIAGNOSIS — E782 Mixed hyperlipidemia: Secondary | ICD-10-CM

## 2021-06-17 DIAGNOSIS — M79604 Pain in right leg: Secondary | ICD-10-CM

## 2021-06-17 NOTE — Telephone Encounter (Signed)
Patient is calling because he said that no one has called him to schedule his [place]. Called [facility] and they did call patient to schedule 05/19/21, he said that he would call them back and never did.  Returned the call back to patient,informed and he said that he forgot, gave him [facility] number to contact.

## 2021-06-17 NOTE — Telephone Encounter (Signed)
Pt states his leg pain/spasms resolved completely "a few days" after stopping his statin drug. Pt agrees that he should be on a medication for cholesterol and agrees with recommendation to see PharmD clinic here. Referral order placed at this time.

## 2021-06-17 NOTE — Telephone Encounter (Signed)
See previous note

## 2021-06-17 NOTE — Telephone Encounter (Signed)
-----   Message from Sherren Mocha, MD sent at 06/16/2021  6:21 AM EST ----- CK levels improved. He has complained of leg weakness in past. Please check with him to see if symptoms have improved off of statin Rx. If so, he should probably be treated with a non-statin drug and would refer him to PharmD lipid clinic. thanks

## 2021-06-18 ENCOUNTER — Other Ambulatory Visit: Payer: Self-pay | Admitting: Family Medicine

## 2021-06-19 ENCOUNTER — Other Ambulatory Visit: Payer: Self-pay

## 2021-06-19 NOTE — Telephone Encounter (Signed)
LOV 08/19/20 Last refill 03/26/21, #30, 3 refills  Please review, thanks!

## 2021-06-26 ENCOUNTER — Ambulatory Visit
Admission: RE | Admit: 2021-06-26 | Discharge: 2021-06-26 | Disposition: A | Discharge status: Home / Self Care | Payer: BC Managed Care – PPO | Source: Ambulatory Visit | Attending: Podiatry | Admitting: Podiatry

## 2021-06-26 DIAGNOSIS — S61402A Unspecified open wound of left hand, initial encounter: Secondary | ICD-10-CM | POA: Diagnosis not present

## 2021-06-26 DIAGNOSIS — L02611 Cutaneous abscess of right foot: Secondary | ICD-10-CM

## 2021-07-05 ENCOUNTER — Other Ambulatory Visit: Payer: Self-pay | Admitting: Family Medicine

## 2021-07-06 NOTE — Telephone Encounter (Signed)
LOV 08/19/20 Last refill 06/19/21, #30, 0 refills  Please review, thanks!

## 2021-07-17 ENCOUNTER — Ambulatory Visit: Payer: BC Managed Care – PPO | Admitting: Pharmacist

## 2021-07-17 ENCOUNTER — Other Ambulatory Visit: Payer: Self-pay

## 2021-07-17 ENCOUNTER — Telehealth: Payer: Self-pay | Admitting: Pharmacist

## 2021-07-17 DIAGNOSIS — E785 Hyperlipidemia, unspecified: Secondary | ICD-10-CM | POA: Diagnosis not present
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
## 2021-07-17 NOTE — Patient Instructions (Signed)
We will submit a prior authorization for Repatha.  ?I will call you once its approved. ? ?Please call us with any questions ?

## 2021-07-17 NOTE — Telephone Encounter (Addendum)
Repatha PA submitted. Available without PA ? ?RxBin: [id number] RxPCN: [id number] RxGrp: [id number] ID: [id number] ? ?Copay card info called into pharmacy.  ?Patient made aware pharmacy had to order medication and would be in tomorrow.  ?

## 2021-07-20 MED ORDER — REPATHA SURECLICK 140 MG/ML ~~LOC~~ SOAJ: 1.0000 "pen " | SUBCUTANEOUS | 11 refills | Status: DC

## 2021-07-30 ENCOUNTER — Other Ambulatory Visit: Payer: Self-pay | Admitting: Family Medicine

## 2021-07-30 ENCOUNTER — Other Ambulatory Visit: Payer: Self-pay | Admitting: Physician Assistant

## 2021-07-31 NOTE — Telephone Encounter (Signed)
LOV 08/19/20 ?Last refill 07/07/21, #30, 0 refills ? ?Please review, thanks! ? ?

## 2021-08-20 ENCOUNTER — Telehealth: Payer: Self-pay

## 2021-08-20 ENCOUNTER — Other Ambulatory Visit: Payer: Self-pay | Admitting: Family Medicine

## 2021-08-20 MED ORDER — PREDNISONE 20 MG PO TABS: ORAL_TABLET | ORAL | 0 refills | Status: DC

## 2021-08-20 NOTE — Telephone Encounter (Signed)
Pt called in stating that he is unsure if came in contact with poison oak/ivy or if he is having a reaction to a new med that he started. Pt states that he has whelps or hives all over his back and it is itching terribly. Pt would like a cb about some suggestions as to what he can do. Please advise. ? ?Cb#: [phone] ?

## 2021-08-20 NOTE — Telephone Encounter (Signed)
Patient called back to follow up; advised first available appointment isn't until Tuesday. Patient agreed to go to UC.  ?

## 2021-08-23 ENCOUNTER — Emergency Department (HOSPITAL_BASED_OUTPATIENT_CLINIC_OR_DEPARTMENT_OTHER)
Admission: EM | Admit: 2021-08-23 | Discharge: 2021-08-23 | Disposition: A | Discharge status: Home / Self Care | Payer: BC Managed Care – PPO | Attending: Emergency Medicine | Admitting: Emergency Medicine

## 2021-08-23 ENCOUNTER — Other Ambulatory Visit: Payer: Self-pay

## 2021-08-23 ENCOUNTER — Encounter (HOSPITAL_BASED_OUTPATIENT_CLINIC_OR_DEPARTMENT_OTHER): Payer: Self-pay | Admitting: Emergency Medicine

## 2021-08-23 DIAGNOSIS — Z7982 Long term (current) use of aspirin: Secondary | ICD-10-CM | POA: Diagnosis not present

## 2021-08-23 DIAGNOSIS — M549 Dorsalgia, unspecified: Secondary | ICD-10-CM | POA: Diagnosis not present

## 2021-08-23 DIAGNOSIS — R519 Headache, unspecified: Secondary | ICD-10-CM | POA: Diagnosis not present

## 2021-08-23 DIAGNOSIS — A770 Spotted fever due to Rickettsia rickettsii: Secondary | ICD-10-CM | POA: Insufficient documentation

## 2021-08-23 DIAGNOSIS — R6883 Chills (without fever): Secondary | ICD-10-CM

## 2021-08-23 DIAGNOSIS — R509 Fever, unspecified: Secondary | ICD-10-CM | POA: Diagnosis not present

## 2021-08-23 DIAGNOSIS — R21 Rash and other nonspecific skin eruption: Secondary | ICD-10-CM

## 2021-08-23 LAB — CBC WITH DIFFERENTIAL/PLATELET
Abs Immature Granulocytes: 0.02 10*3/uL (ref 0.00–0.07)
Basophils Absolute: 0 10*3/uL (ref 0.0–0.1)
Basophils Relative: 0 %
Eosinophils Absolute: 0 10*3/uL (ref 0.0–0.5)
Eosinophils Relative: 0 %
HCT: 41 % (ref 39.0–52.0)
Hemoglobin: 13.6 g/dL (ref 13.0–17.0)
Immature Granulocytes: 0 %
Lymphocytes Relative: 5 %
Lymphs Abs: 0.4 10*3/uL — ABNORMAL LOW (ref 0.7–4.0)
MCH: 32.2 pg (ref 26.0–34.0)
MCHC: 33.2 g/dL (ref 30.0–36.0)
MCV: 97.2 fL (ref 80.0–100.0)
Monocytes Absolute: 0.2 10*3/uL (ref 0.1–1.0)
Monocytes Relative: 3 %
Neutro Abs: 6.7 10*3/uL (ref 1.7–7.7)
Neutrophils Relative %: 92 %
Platelets: 180 10*3/uL (ref 150–400)
RBC: 4.22 MIL/uL (ref 4.22–5.81)
RDW: 12.9 % (ref 11.5–15.5)
WBC: 7.3 10*3/uL (ref 4.0–10.5)
nRBC: 0 % (ref 0.0–0.2)

## 2021-08-23 LAB — COMPREHENSIVE METABOLIC PANEL
ALT: 28 U/L (ref 0–44)
AST: 28 U/L (ref 15–41)
Albumin: 3.9 g/dL (ref 3.5–5.0)
Alkaline Phosphatase: 53 U/L (ref 38–126)
Anion gap: 7 (ref 5–15)
BUN: 18 mg/dL (ref 6–20)
CO2: 23 mmol/L (ref 22–32)
Calcium: 8.9 mg/dL (ref 8.9–10.3)
Chloride: 105 mmol/L (ref 98–111)
Creatinine, Ser: 0.68 mg/dL (ref 0.61–1.24)
GFR, Estimated: >60 mL/min (ref >60)
Glucose, Bld: 145 mg/dL — ABNORMAL HIGH (ref 70–99)
Potassium: 4.6 mmol/L (ref 3.5–5.1)
Sodium: 135 mmol/L (ref 135–145)
Total Bilirubin: 0.5 mg/dL (ref 0.3–1.2)
Total Protein: 6.6 g/dL (ref 6.5–8.1)

## 2021-08-23 MED ORDER — DIPHENHYDRAMINE HCL 50 MG/ML IJ SOLN
50.0000 mg | Freq: Once | INTRAMUSCULAR | Status: AC
  Administered 2021-08-23: 50 mg via INTRAVENOUS
  Filled 2021-08-23: qty 1

## 2021-08-23 MED ORDER — SODIUM CHLORIDE 0.9 % IV BOLUS
1000.0000 mL | Freq: Once | INTRAVENOUS | Status: AC
Start: 2021-08-23 — End: 2021-08-23
  Administered 2021-08-23: 1000 mL via INTRAVENOUS

## 2021-08-23 MED ORDER — PROCHLORPERAZINE EDISYLATE 10 MG/2ML IJ SOLN
10.0000 mg | Freq: Once | INTRAMUSCULAR | Status: AC
  Administered 2021-08-23: 10 mg via INTRAVENOUS
  Filled 2021-08-23: qty 2

## 2021-08-23 MED ORDER — DOXYCYCLINE HYCLATE 100 MG PO CAPS: 100.0000 mg | ORAL_CAPSULE | Freq: Two times a day (BID) | ORAL | 0 refills | Status: AC

## 2021-08-23 NOTE — Discharge Instructions (Signed)
Your history, exam, and work-up today are concerning for [facility] spotted fever as the cause of your symptoms.  We sent off the test to check for this however clinically we are concerned enough as to treat you with empiric antibiotics to help your symptoms.  Your headache improved with medications and your labs were otherwise reassuring as we discussed.  We feel you are stable and safe for discharge home to follow-up with your primary doctor in 2 days however please rest and stay hydrated and take the antibiotics.  If any symptoms change or worsen acutely, please return to the nearest emergency department. ?

## 2021-08-23 NOTE — ED Triage Notes (Signed)
Pt c/o headache and body rash onset Monday. Pt started taking prednisone on Wednesday. ?

## 2021-08-23 NOTE — ED Provider Notes (Signed)
?[facility] ?Provider Note ? ? ?CSN: [id number] ?Arrival date & time: 08/23/21  4010 ? ?  ? ?History ? ?Chief Complaint  ?Patient presents with  ? Headache  ? ? ?Tony Montoya is a 61 y.o. male. ? ?The history is provided by the patient and medical records. No language interpreter was used.  ?Headache ?Pain location:  Generalized ?Quality:  Dull ?Radiates to:  Does not radiate ?Severity currently:  6/10 ?Severity at highest:  6/10 ?Onset quality:  Gradual ?Timing:  Constant ?Progression:  Waxing and waning ?Chronicity:  New ?Relieved by:  Nothing ?Worsened by:  Nothing ?Ineffective treatments:  None tried ?Associated symptoms: back pain, fatigue and myalgias   ?Associated symptoms: no abdominal pain, no congestion, no cough, no diarrhea, no dizziness, no eye pain, no fever (chills and low temp at home), no focal weakness, no loss of balance, no nausea, no neck stiffness, no numbness, no paresthesias, no photophobia, no seizures, no sinus pressure, no visual change, no vomiting and no weakness   ? ?  ? ?Home Medications ?Prior to Admission medications   ?Medication Sig Start Date End Date Taking? Authorizing Provider  ?ALPRAZolam (XANAX) 0.5 MG tablet TAKE 1 TABLET BY MOUTH THREE TIMES A DAY AS NEEDED FOR ANXIETY 07/31/21   Susy Frizzle, MD  ?ASPIRIN LOW DOSE 81 MG EC tablet TAKE 1 TABLET BY MOUTH EVERY DAY SWALLOW WHOLE 10/10/20   Sherren Mocha, MD  ?Evolocumab (REPATHA SURECLICK) 272 MG/ML SOAJ Inject 1 pen into the skin every 14 (fourteen) days. 07/20/21   Sherren Mocha, MD  ?gentamicin cream (GARAMYCIN) 0.1 % Apply 1 application topically 2 (two) times daily. 03/16/21   Criselda Peaches, DPM  ?metoprolol succinate (TOPROL XL) 25 MG 24 hr tablet Take 0.5 tablets (12.5 mg total) by mouth daily. 06/12/21   Sherren Mocha, MD  ?nitroGLYCERIN (NITROSTAT) 0.4 MG SL tablet Place 1 tablet (0.4 mg total) under the tongue every 5 (five) minutes x 3 doses as needed for chest pain. 06/10/20    Cheryln Manly, NP  ?predniSONE (DELTASONE) 20 MG tablet 3 tabs poqday 1-2, 2 tabs poqday 3-4, 1 tab poqday 5-6 08/20/21   Susy Frizzle, MD  ?rosuvastatin (CRESTOR) 20 MG tablet TAKE 1 TABLET BY MOUTH EVERYDAY AT BEDTIME 07/30/21   Bhagat, Crista Luria, PA  ?sildenafil (VIAGRA) 100 MG tablet Take 0.5-1 tablets (50-100 mg total) by mouth daily as needed for erectile dysfunction. 12/28/19   Susy Frizzle, MD  ?   ? ?Allergies    ?Bupropion hcl, Codeine, Simvastatin, and Zolpidem tartrate   ? ?Review of Systems   ?Review of Systems  ?Constitutional:  Positive for chills and fatigue. Negative for diaphoresis and fever (chills and low temp at home).  ?HENT:  Negative for congestion and sinus pressure.   ?Eyes:  Negative for photophobia, pain and visual disturbance.  ?Respiratory:  Negative for cough, chest tightness, shortness of breath and wheezing.   ?Cardiovascular:  Negative for chest pain, palpitations and leg swelling.  ?Gastrointestinal:  Negative for abdominal pain, constipation, diarrhea, nausea and vomiting.  ?Genitourinary:  Negative for dysuria.  ?Musculoskeletal:  Positive for arthralgias, back pain and myalgias. Negative for neck stiffness.  ?Skin:  Positive for rash.  ?Neurological:  Positive for headaches. Negative for dizziness, focal weakness, seizures, weakness, light-headedness, numbness, paresthesias and loss of balance.  ?Psychiatric/Behavioral:  Negative for agitation and confusion.   ?All other systems reviewed and are negative. ? ?Physical Exam ?Updated Vital Signs ?BP 133/78 (BP Location:  Right Arm)   Pulse 60   Temp 99 ?F (37.2 ?C) (Oral)   Resp 18   Ht '5\' 11"'$  (1.803 m)   Wt 74.8 kg   SpO2 99%   BMI 23.01 kg/m?  ?Physical Exam ?Vitals and nursing note reviewed.  ?Constitutional:   ?   General: He is not in acute distress. ?   Appearance: He is well-developed. He is not ill-appearing, toxic-appearing or diaphoretic.  ?HENT:  ?   Head: Normocephalic and atraumatic.  ?   Nose: Nose  normal. No congestion or rhinorrhea.  ?   Mouth/Throat:  ?   Mouth: Mucous membranes are moist.  ?   Pharynx: No oropharyngeal exudate or posterior oropharyngeal erythema.  ?   Comments: No rash in mouth ?Eyes:  ?   Extraocular Movements: Extraocular movements intact.  ?   Conjunctiva/sclera: Conjunctivae normal.  ?   Pupils: Pupils are equal, round, and reactive to light. Pupils are equal.  ?Cardiovascular:  ?   Rate and Rhythm: Normal rate and regular rhythm.  ?   Heart sounds: No murmur heard. ?Pulmonary:  ?   Effort: Pulmonary effort is normal. No respiratory distress.  ?   Breath sounds: Normal breath sounds. No wheezing, rhonchi or rales.  ?Chest:  ?   Chest wall: No tenderness.  ?Abdominal:  ?   General: Abdomen is flat.  ?   Palpations: Abdomen is soft.  ?   Tenderness: There is no abdominal tenderness. There is no guarding or rebound.  ?Musculoskeletal:     ?   General: No swelling or tenderness.  ?   Cervical back: Normal range of motion and neck supple.  ?Skin: ?   General: Skin is warm and dry.  ?   Capillary Refill: Capillary refill takes less than 2 seconds.  ?   Findings: Rash present.  ?   Comments: Diffuse maculopapular rash all over body but sparing the face.  Appears to be slightly on the palms and soles.  ?Neurological:  ?   General: No focal deficit present.  ?   Mental Status: He is alert and oriented to person, place, and time. Mental status is at baseline.  ?   Cranial Nerves: No cranial nerve deficit.  ?   Sensory: No sensory deficit.  ?   Motor: No weakness.  ?Psychiatric:     ?   Mood and Affect: Mood normal.  ? ? ?ED Results / Procedures / Treatments   ?Labs ?(all labs ordered are listed, but only abnormal results are displayed) ?Labs Reviewed  ?CBC WITH DIFFERENTIAL/PLATELET - Abnormal; Notable for the following components:  ?    Result Value  ? Lymphs Abs 0.4 (*)   ? All other components within normal limits  ?COMPREHENSIVE METABOLIC PANEL - Abnormal; Notable for the following  components:  ? Glucose, Bld 145 (*)   ? All other components within normal limits  ?ROCKY MTN SPOTTED FVR ABS PNL(IGG+IGM)  ? ? ?EKG ?None ? ?Radiology ?No results found. ? ?Procedures ?Procedures  ? ? ?Medications Ordered in ED ?Medications  ?sodium chloride 0.9 % bolus 1,000 mL (1,000 mLs Intravenous New Bag/Given 08/23/21 1135)  ?prochlorperazine (COMPAZINE) injection 10 mg (10 mg Intravenous Given 08/23/21 1135)  ?diphenhydrAMINE (BENADRYL) injection 50 mg (50 mg Intravenous Given 08/23/21 1135)  ? ? ?ED Course/ Medical Decision Making/ A&P ?  ?                        ?  Medical Decision Making ?Amount and/or Complexity of Data Reviewed ?Labs: ordered. ? ?Risk ?Prescription drug management. ? ? ? ?Sohil Timko Pilling is a 61 y.o. male with a past medical history significant for CAD with prior STEMI, hyperlipidemia, anxiety, degenerative disease, and GERD who presents with diffuse myalgias, arthralgias, headache, fatigue, chills, and rash.  According to patient, for the last few days he has been developing rash all over his body.  It is sparing his face and mouth however.  He called his PCP who called him some steroids which she has been on for the last 2 days but does not seem to be improving.  He is to see his doctor in 2 days but as it is worsening he wanted to get evaluated.  He reports he is having pain in all of his joints and muscles and the rash is persistent.  He reports he is having moderate headache but denies any true severe neck pain or neck stiffness.  He reports that he spends "all the time outside" but does not know of any specific ticks being on him.  He also did a good amount of yard work recently. ? ?Of note, patient has had reactions to medications in the past but not exactly like this.  He is recently put on Repatha and has had 2 injections of them recently but did not take place right before his rash and symptoms.  He has no history of recommend spotted fever to his knowledge or Lyme disease.  He  denies any other focal skin injuries or isolated symptoms otherwise. ? ?He denies any congestion, cough, chest pain, shortness of breath, nausea, vomiting, constipation, diarrhea, or urinary changes. ? ?On exam, patient

## 2021-08-25 ENCOUNTER — Other Ambulatory Visit: Payer: Self-pay | Admitting: Family Medicine

## 2021-08-25 ENCOUNTER — Ambulatory Visit: Payer: BC Managed Care – PPO | Admitting: Family Medicine

## 2021-08-25 ENCOUNTER — Encounter: Payer: Self-pay | Admitting: Family Medicine

## 2021-08-25 VITALS — BP 102/68 | HR 57 | Temp 97.3°F | Resp 18 | Ht 71.0 in | Wt 174.0 lb

## 2021-08-25 DIAGNOSIS — R21 Rash and other nonspecific skin eruption: Secondary | ICD-10-CM

## 2021-08-25 LAB — ROCKY MTN SPOTTED FVR ABS PNL(IGG+IGM)
RMSF IgG: NEGATIVE
RMSF IgM: 0.27 index (ref 0.00–0.89)

## 2021-08-25 MED ORDER — PREDNISONE 20 MG PO TABS: 60.0000 mg | ORAL_TABLET | Freq: Every day | ORAL | 0 refills | Status: DC

## 2021-08-25 NOTE — Telephone Encounter (Signed)
LOV 08/25/21 ?Last refill 07/31/21, #30, 0 refills ? ?Please review, thanks! ? ?
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
## 2021-08-26 ENCOUNTER — Encounter: Payer: Self-pay | Admitting: Cardiovascular Disease

## 2021-08-27 ENCOUNTER — Encounter: Payer: Self-pay | Admitting: Cardiovascular Disease

## 2021-08-27 DIAGNOSIS — E785 Hyperlipidemia, unspecified: Secondary | ICD-10-CM

## 2021-08-27 LAB — B. BURGDORFI ANTIBODIES BY WB

## 2021-08-27 LAB — COMPLETE METABOLIC PANEL WITH GFR
AG Ratio: 1.6 (calc) (ref 1.0–2.5)
ALT: 30 U/L (ref 9–46)
AST: 23 U/L (ref 10–35)
Albumin: 3.6 g/dL (ref 3.6–5.1)
Alkaline phosphatase (APISO): 65 U/L (ref 35–144)
BUN: 24 mg/dL (ref 7–25)
CO2: 27 mmol/L (ref 20–32)
Calcium: 9 mg/dL (ref 8.6–10.3)
Chloride: 105 mmol/L (ref 98–110)
Creat: 0.8 mg/dL (ref 0.70–1.30)
Globulin: 2.3 g/dL (calc) (ref 1.9–3.7)
Glucose, Bld: 96 mg/dL (ref 65–99)
Potassium: 4.4 mmol/L (ref 3.5–5.3)
Sodium: 141 mmol/L (ref 135–146)
Total Bilirubin: 0.3 mg/dL (ref 0.2–1.2)
Total Protein: 5.9 g/dL — ABNORMAL LOW (ref 6.1–8.1)
eGFR: 102 mL/min/{1.73_m2} (ref >=60)

## 2021-08-27 LAB — CBC WITH DIFFERENTIAL/PLATELET
Absolute Monocytes: 562 cells/uL (ref 200–950)
Basophils Absolute: 37 cells/uL (ref 0–200)
Basophils Relative: 0.5 %
Eosinophils Absolute: 212 cells/uL (ref 15–500)
Eosinophils Relative: 2.9 %
HCT: 39.1 % (ref 38.5–50.0)
Hemoglobin: 13.4 g/dL (ref 13.2–17.1)
Lymphs Abs: 2175 cells/uL (ref 850–3900)
MCH: 33.5 pg — ABNORMAL HIGH (ref 27.0–33.0)
MCHC: 34.3 g/dL (ref 32.0–36.0)
MCV: 97.8 fL (ref 80.0–100.0)
MPV: 11.4 fL (ref 7.5–12.5)
Monocytes Relative: 7.7 %
Neutro Abs: 4314 cells/uL (ref 1500–7800)
Neutrophils Relative %: 59.1 %
Platelets: 223 10*3/uL (ref 140–400)
RBC: 4 10*6/uL — ABNORMAL LOW (ref 4.20–5.80)
RDW: 11.8 % (ref 11.0–15.0)
Total Lymphocyte: 29.8 %
WBC: 7.3 10*3/uL (ref 3.8–10.8)

## 2021-08-27 LAB — SEDIMENTATION RATE: Sed Rate: 2 mm/h (ref 0–20)

## 2021-08-27 LAB — ANA: Anti Nuclear Antibody (ANA): NEGATIVE

## 2021-08-28 MED ORDER — NEXLETOL 180 MG PO TABS: 1.0000 | ORAL_TABLET | Freq: Every day | ORAL | 11 refills | Status: DC

## 2021-09-18 ENCOUNTER — Other Ambulatory Visit: Payer: Self-pay | Admitting: Family Medicine

## 2021-09-21 ENCOUNTER — Other Ambulatory Visit: Payer: Self-pay

## 2021-09-21 NOTE — Telephone Encounter (Signed)
LOV 08/25/21 ?Last refill 08/25/21, #30, 0 refills ? ?Please review, thanks! ? ?

## 2021-09-21 NOTE — Telephone Encounter (Signed)
Requested medication (s) are due for refill today: yes ? ?Requested medication (s) are on the active medication list: yes ? ?Last refill:  08/25/21 #30/0 ? ?Future visit scheduled: no ? ?Notes to clinic:  Unable to refill per protocol, cannot delegate. ? ?  ?Requested Prescriptions  ?Pending Prescriptions Disp Refills  ? ALPRAZolam (XANAX) 0.5 MG tablet [Pharmacy Med Name: ALPRAZOLAM 0.5 MG TABLET] 30 tablet 0  ?  Sig: TAKE 1 TABLET BY MOUTH THREE TIMES A DAY AS NEEDED FOR ANXIETY  ?  ? Not Delegated - Psychiatry: Anxiolytics/Hypnotics 2 Failed - 09/18/2021  6:28 PM  ?  ?  Failed - This refill cannot be delegated  ?  ?  Failed - Urine Drug Screen completed in last 360 days  ?  ?  Passed - Patient is not pregnant  ?  ?  Passed - Valid encounter within last 6 months  ?  Recent Outpatient Visits   ? ?      ? 3 weeks ago Morbilliform rash  ? [facility], Cammie Mcgee, MD  ? 1 year ago ASCVD (arteriosclerotic cardiovascular disease)  ? [facility] Pickard, Cammie Mcgee, MD  ? 1 year ago Colon cancer screening  ? [facility] Pickard, Cammie Mcgee, MD  ? 3 years ago Left sided sciatica  ? [facility], Cammie Mcgee, MD  ? 3 years ago Pure hypercholesterolemia  ? [facility] Pick[place], Cammie Mcgee, MD  ? ?  ?  ? ? ?  ?  ?  ? ?

## 2021-10-17 ENCOUNTER — Other Ambulatory Visit: Payer: Self-pay | Admitting: Family Medicine

## 2021-10-19 NOTE — Telephone Encounter (Signed)
Requested medication (s) are due for refill today: yes  Requested medication (s) are on the active medication list: yes  Last refill:  09/21/21 #30  Future visit scheduled: no  Notes to clinic:  med not delegated to NT to RF   Requested Prescriptions  Pending Prescriptions Disp Refills   ALPRAZolam (XANAX) 0.5 MG tablet [Pharmacy Med Name: ALPRAZOLAM 0.5 MG TABLET] 30 tablet 0    Sig: TAKE 1 TABLET BY MOUTH THREE TIMES A DAY AS NEEDED FOR ANXIETY     There is no refill protocol information for this order

## 2021-10-25 ENCOUNTER — Other Ambulatory Visit: Payer: Self-pay | Admitting: Cardiovascular Disease

## 2021-11-05 NOTE — Addendum Note (Signed)
Addended by: Marcelle Overlie D on: 11/05/2021 12:56 PM   Modules accepted: Orders

## 2021-11-05 NOTE — Telephone Encounter (Signed)
Patient doing much better on Nexletol. He will come in Monday for lab work.

## 2021-11-09 ENCOUNTER — Other Ambulatory Visit: Payer: BC Managed Care – PPO

## 2021-11-12 ENCOUNTER — Other Ambulatory Visit: Payer: BC Managed Care – PPO | Admitting: *Deleted

## 2021-11-12 DIAGNOSIS — E785 Hyperlipidemia, unspecified: Secondary | ICD-10-CM | POA: Diagnosis not present

## 2021-11-17 ENCOUNTER — Other Ambulatory Visit: Payer: Self-pay | Admitting: Family Medicine

## 2021-11-17 ENCOUNTER — Telehealth: Payer: Self-pay | Admitting: Pharmacist

## 2021-11-17 DIAGNOSIS — E785 Hyperlipidemia, unspecified: Secondary | ICD-10-CM

## 2021-11-17 LAB — COMPREHENSIVE METABOLIC PANEL
ALT: 31 IU/L (ref 0–44)
AST: 28 IU/L (ref 0–40)
Albumin/Globulin Ratio: 1.8 (ref 1.2–2.2)
Albumin: 4.6 g/dL (ref 3.8–4.9)
Alkaline Phosphatase: 60 IU/L (ref 44–121)
BUN/Creatinine Ratio: 28 — ABNORMAL HIGH (ref 9–20)
BUN: 23 mg/dL (ref 6–24)
Bilirubin Total: <0.2 mg/dL (ref 0.0–1.2)
CO2: 17 mmol/L — ABNORMAL LOW (ref 20–29)
Calcium: 9.5 mg/dL (ref 8.7–10.2)
Chloride: 111 mmol/L — ABNORMAL HIGH (ref 96–106)
Creatinine, Ser: 0.81 mg/dL (ref 0.76–1.27)
Globulin, Total: 2.5 g/dL (ref 1.5–4.5)
Glucose: 118 mg/dL — ABNORMAL HIGH (ref 70–99)
Potassium: 4.7 mmol/L (ref 3.5–5.2)
Sodium: 145 mmol/L — ABNORMAL HIGH (ref 134–144)
Total Protein: 7.1 g/dL (ref 6.0–8.5)
eGFR: 102 mL/min/{1.73_m2} (ref >59)

## 2021-11-17 LAB — LIPID PANEL
Chol/HDL Ratio: 3.4 ratio (ref 0.0–5.0)
Cholesterol, Total: 192 mg/dL (ref 100–199)
HDL: 57 mg/dL (ref >39)
LDL Chol Calc (NIH): 109 mg/dL — ABNORMAL HIGH (ref 0–99)
Triglycerides: 146 mg/dL (ref 0–149)
VLDL Cholesterol Cal: 26 mg/dL (ref 5–40)

## 2021-11-17 LAB — APOLIPOPROTEIN B: Apolipoprotein B: 80 mg/dL (ref <90)

## 2021-11-17 MED ORDER — EZETIMIBE 10 MG PO TABS: 10.0000 mg | ORAL_TABLET | Freq: Every day | ORAL | 3 refills | Status: DC

## 2021-11-17 NOTE — Addendum Note (Signed)
Addended by: Marcelle Overlie D on: 11/17/2021 10:26 AM   Modules accepted: Orders

## 2021-11-17 NOTE — Telephone Encounter (Signed)
ApoB is close to goal of <80. Patient was intolerant to Repatha, simvastatin '40mg'$  daily, atorvastatin '20mg'$ , pravastatin '20mg'$ , pravastatin '40mg'$ , ezetimibe '10mg'$  daily (leg cramps), rosuvastatin '20mg'$  daily (leg weakness, elevated CK).   Doing well on Nexletol. Patient doesn't remember trying zetia. Not interested in [place]. He is willing to try zetia. Will Rx separate from [facility] at first to see if pt tolerates. Repeat labs in 2 months.

## 2021-11-18 NOTE — Telephone Encounter (Signed)
LOV 08/25/21 Last refill 10/22/21, #30, 0 refills  Please review, thanks!

## 2021-12-17 ENCOUNTER — Other Ambulatory Visit: Payer: Self-pay | Admitting: Family Medicine

## 2021-12-18 NOTE — Telephone Encounter (Signed)
Requested medication (s) are due for refill today: yes  Requested medication (s) are on the active medication list:yes  Last refill:  11/19/21  Future visit scheduled: yes  Notes to clinic:  Unable to refill per protocol, cannot delegate.      Requested Prescriptions  Pending Prescriptions Disp Refills   ALPRAZolam (XANAX) 0.5 MG tablet [Pharmacy Med Name: ALPRAZOLAM 0.5 MG TABLET] 30 tablet 0    Sig: TAKE 1 TABLET BY MOUTH THREE TIMES A DAY AS NEEDED FOR ANXIETY     Not Delegated - Psychiatry: Anxiolytics/Hypnotics 2 Failed - 12/17/2021  6:15 PM      Failed - This refill cannot be delegated      Failed - Urine Drug Screen completed in last 360 days      Passed - Patient is not pregnant      Passed - Valid encounter within last 6 months    Recent Outpatient Visits           3 months ago Morbilliform rash   [place] Susy Frizzle, MD   1 year ago ASCVD (arteriosclerotic cardiovascular disease)   [place] Susy Frizzle, MD   1 year ago Colon cancer screening   [place], Warren T, MD   3 years ago Left sided sciatica   [place] Pickard, Cammie Mcgee, MD   3 years ago Pure hypercholesterolemia   [place] Pickard, Cammie Mcgee, MD

## 2021-12-21 ENCOUNTER — Telehealth: Payer: Self-pay

## 2021-12-21 NOTE — Telephone Encounter (Signed)
Pt called in to inquire about this refill for this med ALPRAZolam Tony Montoya) 0.5 MG tablet [[id number]]    Cb#: [phone]

## 2022-01-18 ENCOUNTER — Other Ambulatory Visit: Payer: Self-pay | Admitting: Family Medicine

## 2022-01-19 ENCOUNTER — Ambulatory Visit: Payer: BC Managed Care – PPO | Attending: Cardiovascular Disease

## 2022-01-19 DIAGNOSIS — E785 Hyperlipidemia, unspecified: Secondary | ICD-10-CM | POA: Diagnosis not present

## 2022-01-20 ENCOUNTER — Other Ambulatory Visit: Payer: Self-pay | Admitting: Cardiovascular Disease

## 2022-01-20 ENCOUNTER — Telehealth: Payer: Self-pay | Admitting: Pharmacist

## 2022-01-20 LAB — COMPREHENSIVE METABOLIC PANEL
ALT: 34 IU/L (ref 0–44)
AST: 29 IU/L (ref 0–40)
Albumin/Globulin Ratio: 2 (ref 1.2–2.2)
Albumin: 4.3 g/dL (ref 3.8–4.9)
Alkaline Phosphatase: 51 IU/L (ref 44–121)
BUN/Creatinine Ratio: 20 (ref 9–20)
BUN: 17 mg/dL (ref 6–24)
Bilirubin Total: 0.4 mg/dL (ref 0.0–1.2)
CO2: 23 mmol/L (ref 20–29)
Calcium: 9.5 mg/dL (ref 8.7–10.2)
Chloride: 104 mmol/L (ref 96–106)
Creatinine, Ser: 0.83 mg/dL (ref 0.76–1.27)
Globulin, Total: 2.1 g/dL (ref 1.5–4.5)
Glucose: 107 mg/dL — ABNORMAL HIGH (ref 70–99)
Potassium: 4.4 mmol/L (ref 3.5–5.2)
Sodium: 139 mmol/L (ref 134–144)
Total Protein: 6.4 g/dL (ref 6.0–8.5)
eGFR: 101 mL/min/{1.73_m2} (ref >59)

## 2022-01-20 LAB — LIPID PANEL
Chol/HDL Ratio: 2.7 ratio (ref 0.0–5.0)
Cholesterol, Total: 153 mg/dL (ref 100–199)
HDL: 56 mg/dL (ref >39)
LDL Chol Calc (NIH): 78 mg/dL (ref 0–99)
Triglycerides: 104 mg/dL (ref 0–149)
VLDL Cholesterol Cal: 19 mg/dL (ref 5–40)

## 2022-01-20 LAB — APOLIPOPROTEIN B: Apolipoprotein B: 64 mg/dL (ref <90)

## 2022-01-20 MED ORDER — NEXLIZET 180-10 MG PO TABS
1.0000 | ORAL_TABLET | Freq: Every day | ORAL | 3 refills | Status: DC
Start: 2022-01-20 — End: 2022-09-16

## 2022-01-20 NOTE — Telephone Encounter (Signed)
Spoke with patient and reviewed lab results. He wishes to combine Nexletol and zetia into one pill. Will send Rx for Nexlizet. ApoB at goal.

## 2022-02-09 ENCOUNTER — Other Ambulatory Visit: Payer: Self-pay | Admitting: Family Medicine

## 2022-02-10 NOTE — Telephone Encounter (Signed)
Requested medication (s) are due for refill today - yes  Requested medication (s) are on the active medication list -yes  Future visit scheduled -no  Last refill: 01/18/22 #30   Notes to clinic: non delegated Rx  Requested Prescriptions  Pending Prescriptions Disp Refills   ALPRAZolam (XANAX) 0.5 MG tablet [Pharmacy Med Name: ALPRAZOLAM 0.5 MG TABLET] 30 tablet 0    Sig: TAKE 1 TABLET BY MOUTH THREE TIMES A DAY AS NEEDED FOR ANXIETY     Not Delegated - Psychiatry: Anxiolytics/Hypnotics 2 Failed - 02/09/2022 10:22 PM      Failed - This refill cannot be delegated      Failed - Urine Drug Screen completed in last 360 days      Passed - Patient is not pregnant      Passed - Valid encounter within last 6 months    Recent Outpatient Visits           5 months ago Morbilliform rash   [place] Pickard, Cammie Mcgee, MD   1 year ago ASCVD (arteriosclerotic cardiovascular disease)   [place] Pickard, Cammie Mcgee, MD   2 years ago Colon cancer screening   [place], Cammie Mcgee, MD   3 years ago Left sided sciatica   [place] Pickard, Cammie Mcgee, MD   3 years ago Pure hypercholesterolemia   [place], Cammie Mcgee, MD                 Requested Prescriptions  Pending Prescriptions Disp Refills   ALPRAZolam (XANAX) 0.5 MG tablet [Pharmacy Med Name: ALPRAZOLAM 0.5 MG TABLET] 30 tablet 0    Sig: TAKE 1 TABLET BY MOUTH THREE TIMES A DAY AS NEEDED FOR ANXIETY     Not Delegated - Psychiatry: Anxiolytics/Hypnotics 2 Failed - 02/09/2022 10:22 PM      Failed - This refill cannot be delegated      Failed - Urine Drug Screen completed in last 360 days      Passed - Patient is not pregnant      Passed - Valid encounter within last 6 months    Recent Outpatient Visits           5 months ago Morbilliform rash   [place] Susy Frizzle, MD   1 year ago ASCVD  (arteriosclerotic cardiovascular disease)   [place] Susy Frizzle, MD   2 years ago Colon cancer screening   [place], Warren T, MD   3 years ago Left sided sciatica   [place] Pickard, Cammie Mcgee, MD   3 years ago Pure hypercholesterolemia   [place], Cammie Mcgee, MD

## 2022-02-12 ENCOUNTER — Other Ambulatory Visit: Payer: Self-pay | Admitting: Family Medicine

## 2022-02-12 ENCOUNTER — Telehealth: Payer: Self-pay

## 2022-02-12 MED ORDER — ALPRAZOLAM 0.5 MG PO TABS
ORAL_TABLET | ORAL | 0 refills | Status: DC
Start: 2022-02-12 — End: 2022-02-18

## 2022-02-12 NOTE — Telephone Encounter (Signed)
Pt called in to get a courtesy refill of this med ALPRAZolam Duanne Moron) 0.5 MG tablet [[id number]]    Order Details Dose, Route, Frequency: As Directed  Dispense Quantity: 30 tablet Refills: 0   Note to Pharmacy: Not to exceed 4 additional fills before 06/19/2022       Sig: TAKE 1 TABLET BY MOUTH THREE TIMES A DAY AS NEEDED FOR ANXIETY       Start Date: 01/18/22 End Date: --  Written Date: 01/18/22 Expiration Date: 07/17/22  Original Order:  ALPRAZolam Duanne Moron) 0.5 MG tablet [[id number]]   Pt states that he will going out of town and is down to his last 2 pills. Pt was informed that he will need an OV with pcp for any future refills. Pt is scheduled for Thurs 02/18/22.  LOV: 08/25/21  PHARMACY: [id number] [place], [place] - [address]

## 2022-02-18 ENCOUNTER — Ambulatory Visit: Payer: BC Managed Care – PPO | Admitting: Family Medicine

## 2022-02-18 VITALS — BP 108/70 | HR 70 | Temp 98.1°F | Resp 16 | Wt 170.0 lb

## 2022-02-18 DIAGNOSIS — Z125 Encounter for screening for malignant neoplasm of prostate: Secondary | ICD-10-CM | POA: Diagnosis not present

## 2022-02-18 DIAGNOSIS — I2510 Atherosclerotic heart disease of native coronary artery without angina pectoris: Secondary | ICD-10-CM | POA: Diagnosis not present

## 2022-02-18 DIAGNOSIS — F411 Generalized anxiety disorder: Secondary | ICD-10-CM | POA: Diagnosis not present

## 2022-02-18 MED ORDER — ALPRAZOLAM 0.5 MG PO TABS: ORAL_TABLET | ORAL | 0 refills | Status: DC

## 2022-02-18 MED ORDER — NITROGLYCERIN 0.4 MG SL SUBL: 0.4000 mg | SUBLINGUAL_TABLET | SUBLINGUAL | 2 refills | Status: DC | PRN
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
## 2022-02-19 LAB — CBC WITH DIFFERENTIAL/PLATELET
Absolute Monocytes: 609 cells/uL (ref 200–950)
Basophils Absolute: 58 cells/uL (ref 0–200)
Basophils Relative: 1 %
Eosinophils Absolute: 151 cells/uL (ref 15–500)
Eosinophils Relative: 2.6 %
HCT: 42.1 % (ref 38.5–50.0)
Hemoglobin: 14.5 g/dL (ref 13.2–17.1)
Lymphs Abs: 1920 cells/uL (ref 850–3900)
MCH: 33.2 pg — ABNORMAL HIGH (ref 27.0–33.0)
MCHC: 34.4 g/dL (ref 32.0–36.0)
MCV: 96.3 fL (ref 80.0–100.0)
MPV: 11.5 fL (ref 7.5–12.5)
Monocytes Relative: 10.5 %
Neutro Abs: 3062 cells/uL (ref 1500–7800)
Neutrophils Relative %: 52.8 %
Platelets: 216 10*3/uL (ref 140–400)
RBC: 4.37 10*6/uL (ref 4.20–5.80)
RDW: 11.8 % (ref 11.0–15.0)
Total Lymphocyte: 33.1 %
WBC: 5.8 10*3/uL (ref 3.8–10.8)

## 2022-02-19 LAB — PSA: PSA: 0.39 ng/mL (ref <=4.00)

## 2022-02-26 IMAGING — CR DG CHEST 2V
2 series · 2 of 2 positions shown · non-contrast
Comparison: Chest x-ray 12/13/2015.

CLINICAL DATA: 60-year-old male with history of shortness of
breath.

EXAM:
CHEST - 2 VIEW

[w chest pa]
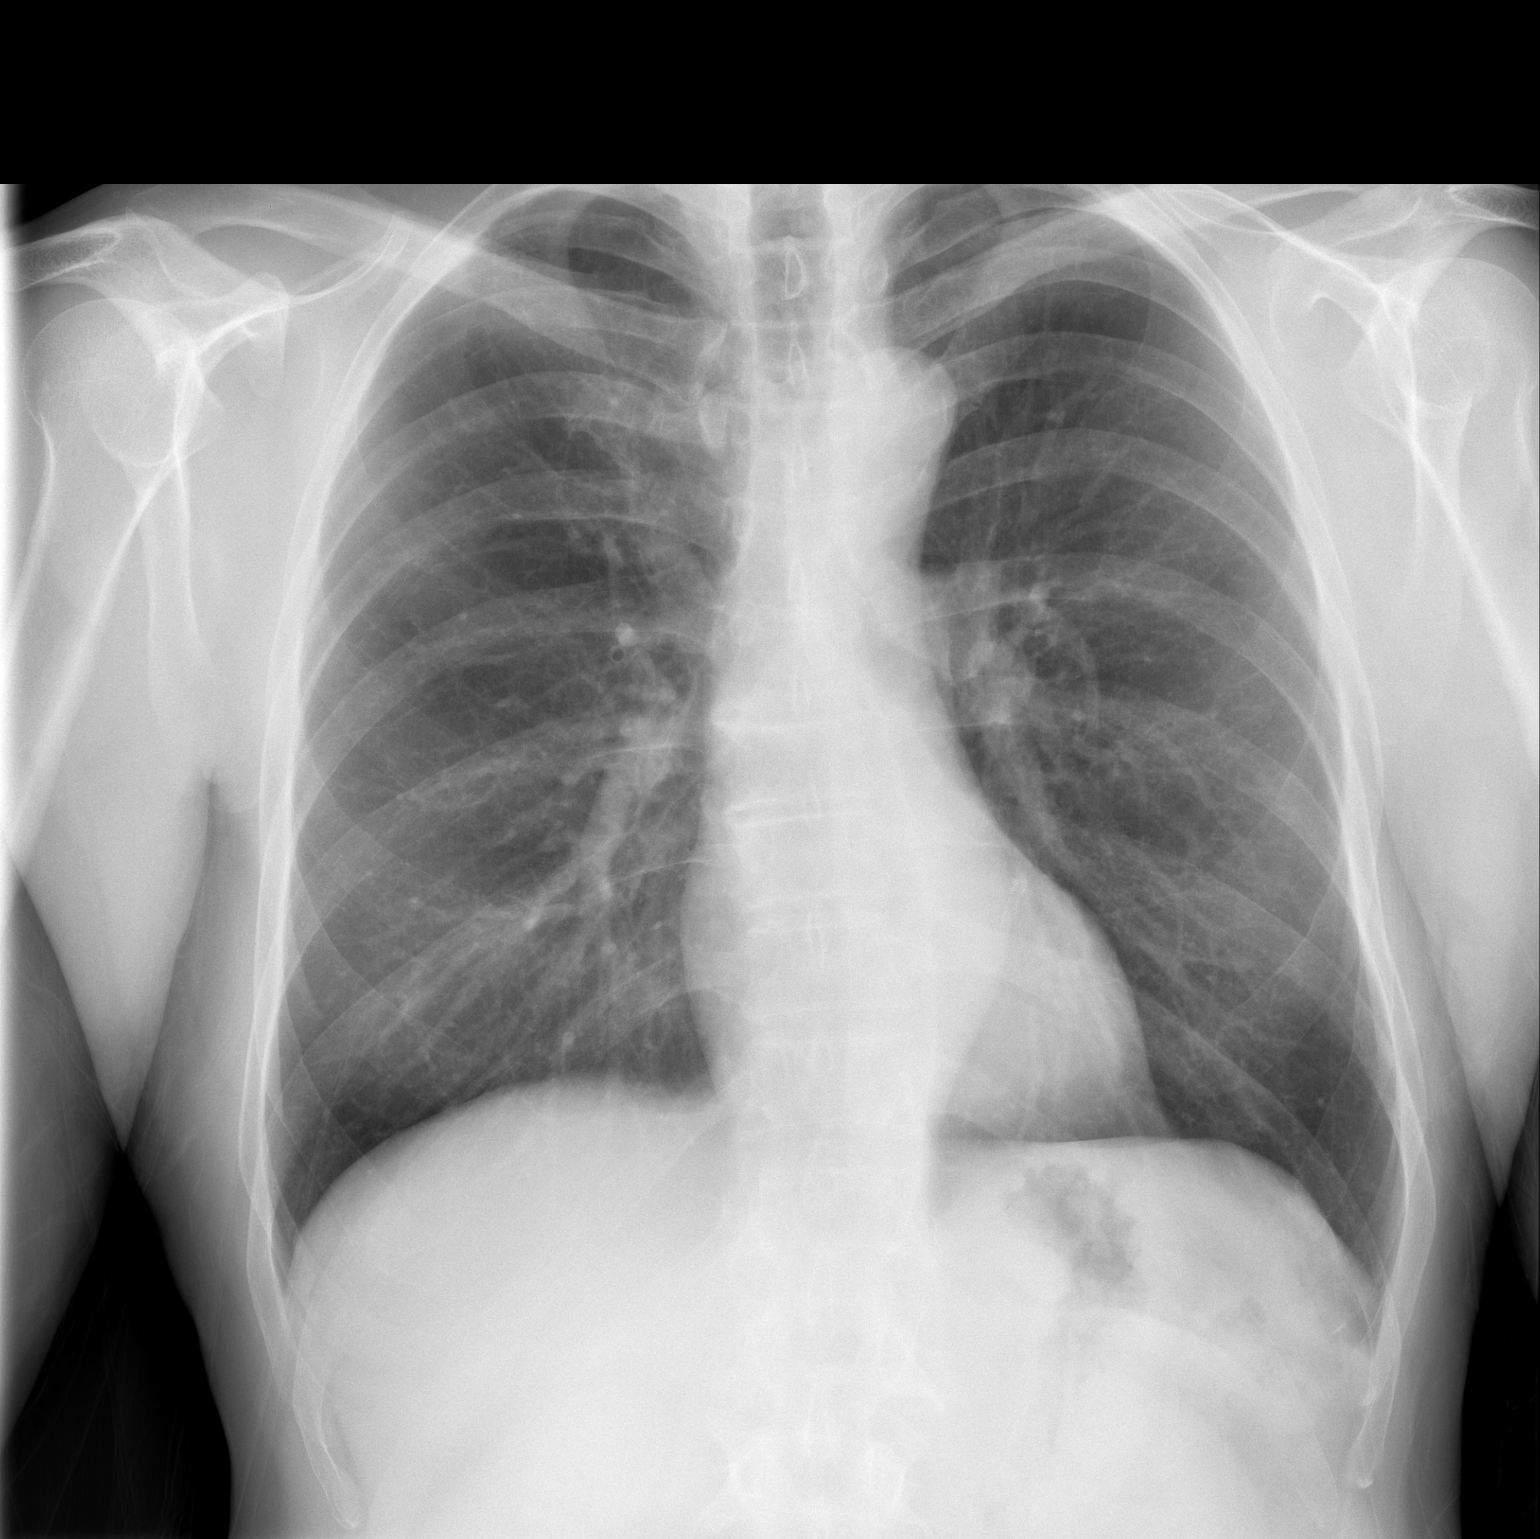

[w chest lat]
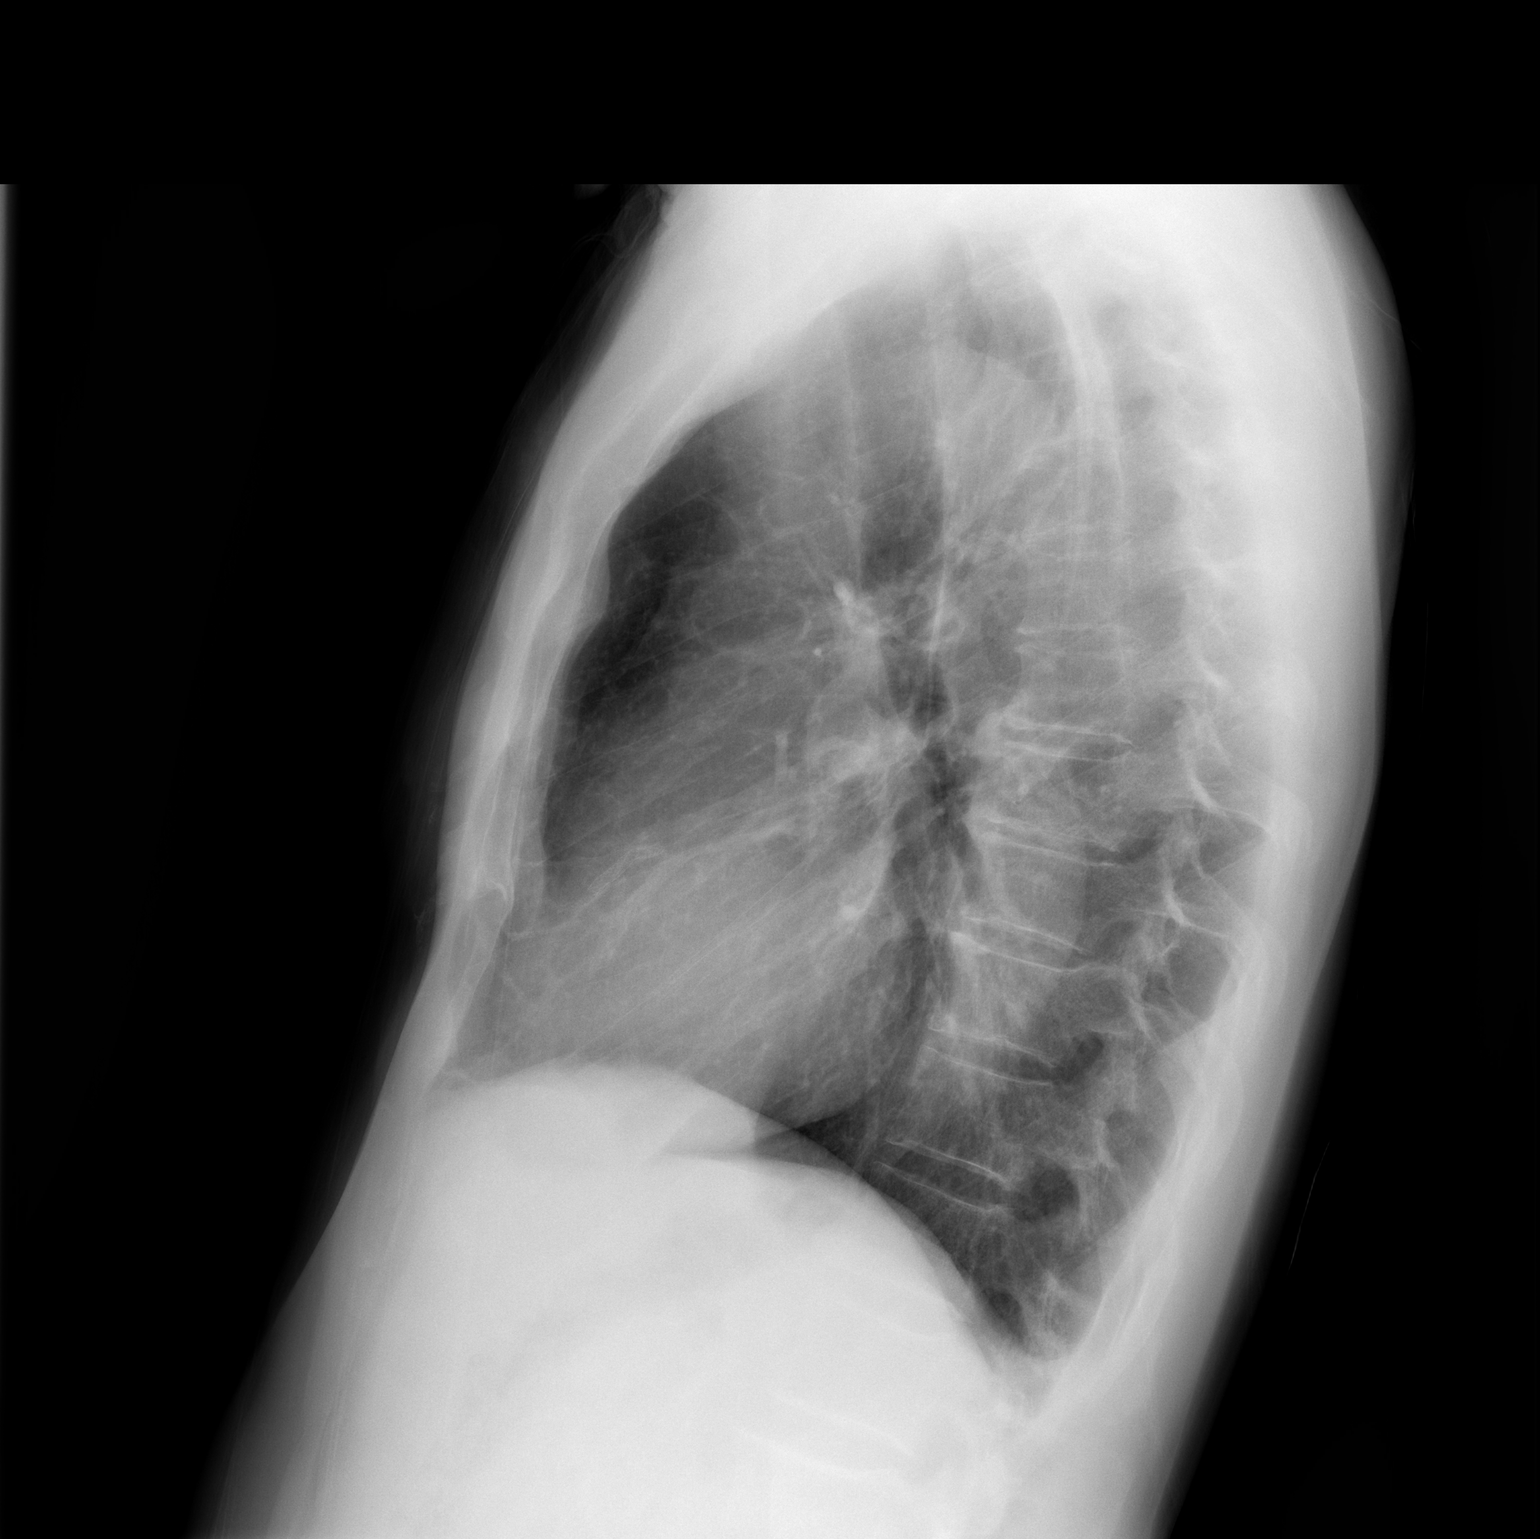

[2 of 2 positions shown; findings below may reference images not displayed]

FINDINGS: Lung volumes are normal. No consolidative airspace disease. No
pleural effusions. No pneumothorax. No pulmonary nodule or mass
noted. Pulmonary vasculature and the cardiomediastinal silhouette
are within normal limits. Atherosclerotic calcifications are noted
in the thoracic aorta.
IMPRESSION: 1.  No radiographic evidence of acute cardiopulmonary disease.
2. Aortic atherosclerosis.

## 2022-03-15 ENCOUNTER — Other Ambulatory Visit: Payer: Self-pay | Admitting: Family Medicine

## 2022-04-04 ENCOUNTER — Other Ambulatory Visit: Payer: Self-pay

## 2022-04-04 ENCOUNTER — Emergency Department (HOSPITAL_COMMUNITY)
Admission: EM | Admit: 2022-04-04 | Discharge: 2022-04-05 | Disposition: A | Discharge status: Home / Self Care | Payer: BC Managed Care – PPO | Attending: Emergency Medicine | Admitting: Emergency Medicine

## 2022-04-04 ENCOUNTER — Encounter (HOSPITAL_COMMUNITY): Payer: Self-pay

## 2022-04-04 ENCOUNTER — Emergency Department (HOSPITAL_COMMUNITY): Payer: BC Managed Care – PPO

## 2022-04-04 DIAGNOSIS — Z7982 Long term (current) use of aspirin: Secondary | ICD-10-CM | POA: Insufficient documentation

## 2022-04-04 DIAGNOSIS — R072 Precordial pain: Secondary | ICD-10-CM | POA: Diagnosis not present

## 2022-04-04 DIAGNOSIS — W208XXA Other cause of strike by thrown, projected or falling object, initial encounter: Secondary | ICD-10-CM | POA: Insufficient documentation

## 2022-04-04 DIAGNOSIS — Y92007 Garden or yard of unspecified non-institutional (private) residence as the place of occurrence of the external cause: Secondary | ICD-10-CM | POA: Insufficient documentation

## 2022-04-04 DIAGNOSIS — S199XXA Unspecified injury of neck, initial encounter: Secondary | ICD-10-CM | POA: Diagnosis not present

## 2022-04-04 DIAGNOSIS — M47812 Spondylosis without myelopathy or radiculopathy, cervical region: Secondary | ICD-10-CM | POA: Diagnosis not present

## 2022-04-04 DIAGNOSIS — M2578 Osteophyte, vertebrae: Secondary | ICD-10-CM | POA: Diagnosis not present

## 2022-04-04 DIAGNOSIS — S0990XA Unspecified injury of head, initial encounter: Secondary | ICD-10-CM

## 2022-04-04 DIAGNOSIS — S060X0A Concussion without loss of consciousness, initial encounter: Secondary | ICD-10-CM | POA: Insufficient documentation

## 2022-04-04 DIAGNOSIS — Y990 Civilian activity done for income or pay: Secondary | ICD-10-CM | POA: Insufficient documentation

## 2022-04-04 LAB — CBC
HCT: 39.6 % (ref 39.0–52.0)
Hemoglobin: 13.3 g/dL (ref 13.0–17.0)
MCH: 33 pg (ref 26.0–34.0)
MCHC: 33.6 g/dL (ref 30.0–36.0)
MCV: 98.3 fL (ref 80.0–100.0)
Platelets: 212 10*3/uL (ref 150–400)
RBC: 4.03 MIL/uL — ABNORMAL LOW (ref 4.22–5.81)
RDW: 13 % (ref 11.5–15.5)
WBC: 12.9 10*3/uL — ABNORMAL HIGH (ref 4.0–10.5)
nRBC: 0 % (ref 0.0–0.2)

## 2022-04-04 LAB — BASIC METABOLIC PANEL
Anion gap: 8 (ref 5–15)
BUN: 20 mg/dL (ref 6–20)
CO2: 22 mmol/L (ref 22–32)
Calcium: 9 mg/dL (ref 8.9–10.3)
Chloride: 108 mmol/L (ref 98–111)
Creatinine, Ser: 0.83 mg/dL (ref 0.61–1.24)
GFR, Estimated: >60 mL/min (ref >60)
Glucose, Bld: 123 mg/dL — ABNORMAL HIGH (ref 70–99)
Potassium: 3.3 mmol/L — ABNORMAL LOW (ref 3.5–5.1)
Sodium: 138 mmol/L (ref 135–145)

## 2022-04-04 LAB — TROPONIN I (HIGH SENSITIVITY): Troponin I (High Sensitivity): 10 ng/L (ref <18)

## 2022-04-04 LAB — D-DIMER, QUANTITATIVE: D-Dimer, Quant: <0.27 ug/mL-FEU (ref 0.00–0.50)

## 2022-04-04 MED ORDER — METOCLOPRAMIDE HCL 5 MG/ML IJ SOLN
10.0000 mg | Freq: Once | INTRAMUSCULAR | Status: AC
  Administered 2022-04-04: 10 mg via INTRAVENOUS
  Filled 2022-04-04: qty 2

## 2022-04-04 MED ORDER — FENTANYL CITRATE PF 50 MCG/ML IJ SOSY
50.0000 ug | PREFILLED_SYRINGE | Freq: Once | INTRAMUSCULAR | Status: AC
  Administered 2022-04-04: 50 ug via INTRAVENOUS
  Filled 2022-04-04: qty 1

## 2022-04-04 NOTE — Discharge Instructions (Addendum)
Continue medications as previously prescribed.  Take Tylenol 1000 mg every 6 hours as needed for headache.  Follow-up with your cardiologist if your symptoms persist, and return to the ER if symptoms significantly worsen or change.

## 2022-04-04 NOTE — ED Triage Notes (Signed)
PVO with Spouse c/o head pain after blunt force injury from a falling tree limp

## 2022-04-05 LAB — TROPONIN I (HIGH SENSITIVITY): Troponin I (High Sensitivity): 11 ng/L (ref <18)

## 2022-04-05 IMAGING — US US EXTREM LOW*L* LIMITED
1 series · 10 of 10 positions shown · non-contrast
Comparison: None.

CLINICAL DATA: Puncture wound between fourth and fifth digits,
possible glass or abscess

EXAM:
ULTRASOUND RIGHT LOWER EXTREMITY LIMITED
TECHNIQUE: Ultrasound examination of the lower extremity soft tissues was
performed in the area of clinical concern.

[Series 1: us extrem low*left* limited · 0.02mm/px · 10 acquisitions, 10 frames shown]
[im 1/10]
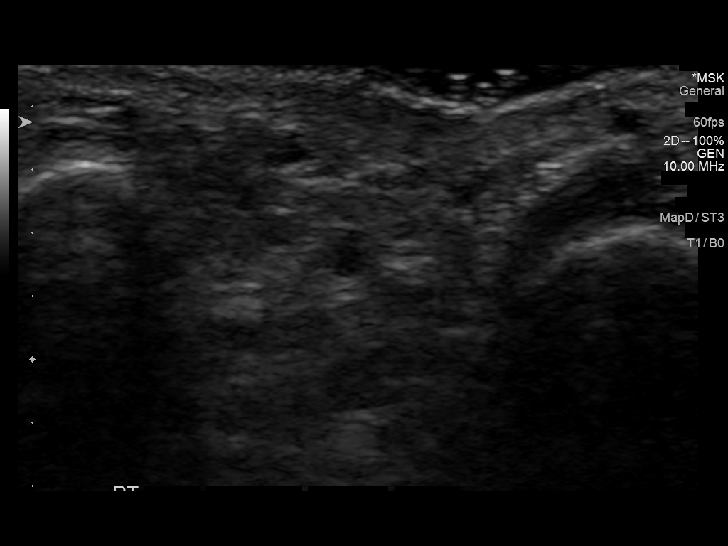
[im 2/10]
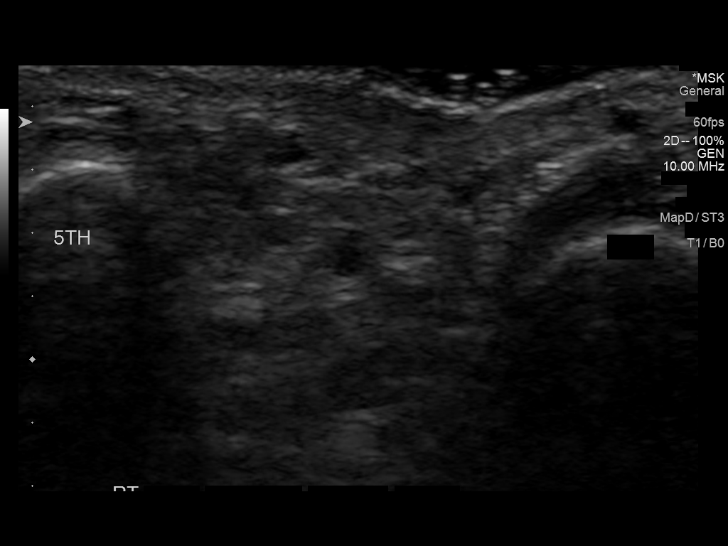
[im 3/10]
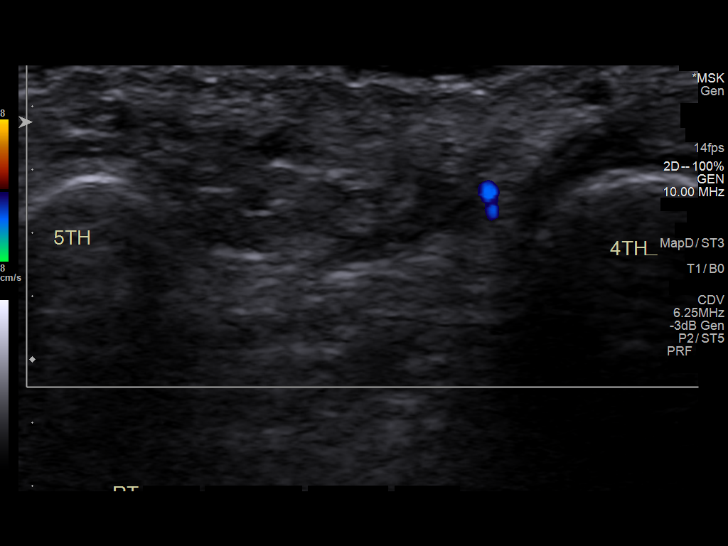
[im 4/10]
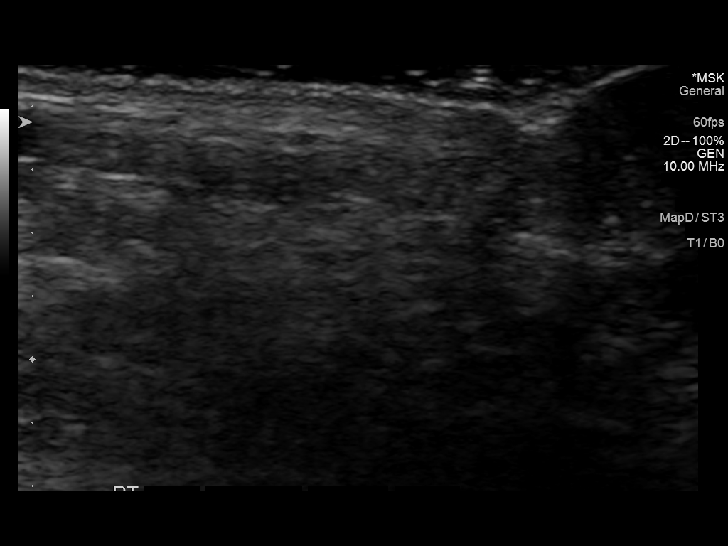
[im 5/10]
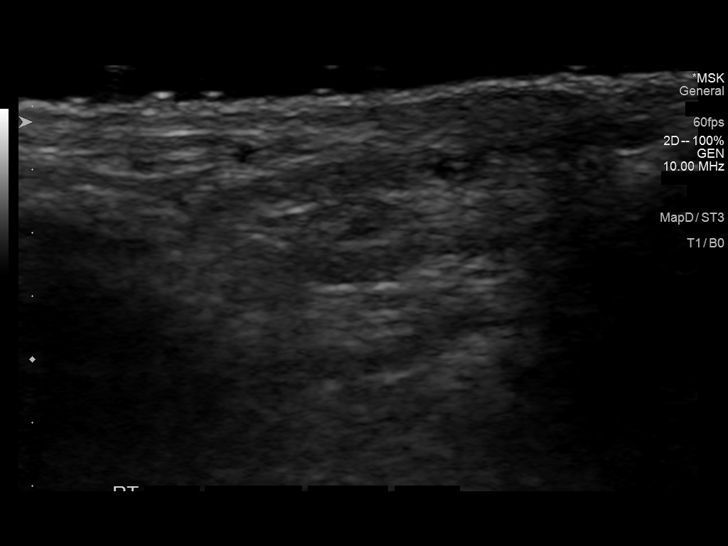
[im 6/10]
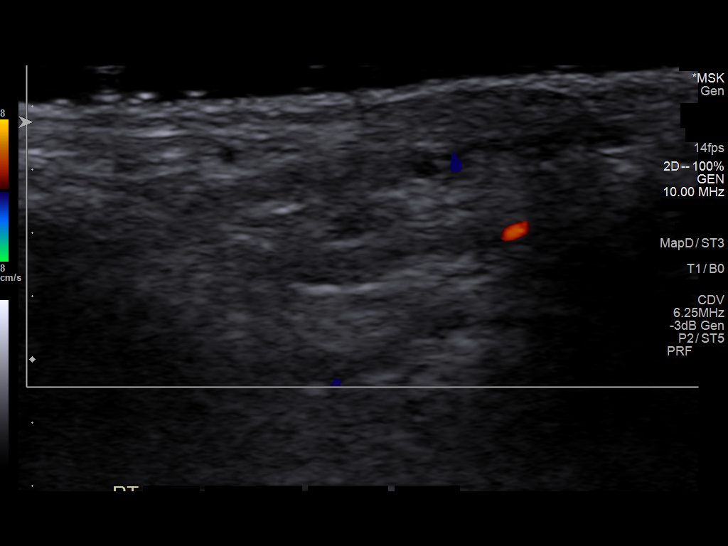
[im 7/10]
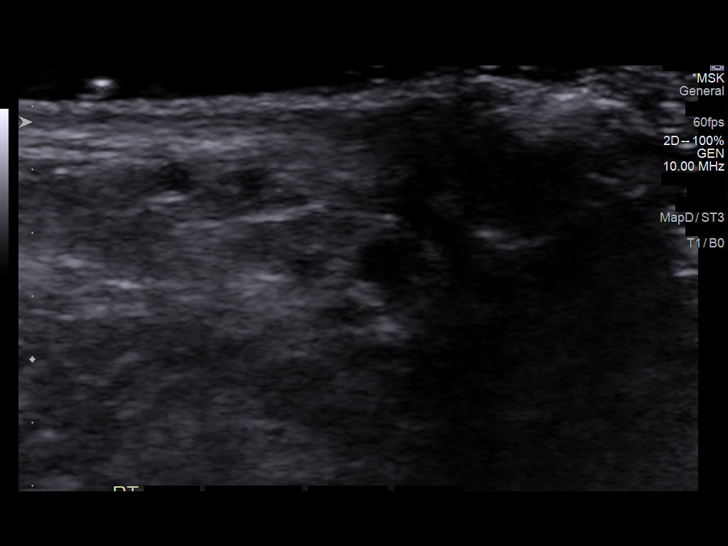
[im 8/10]
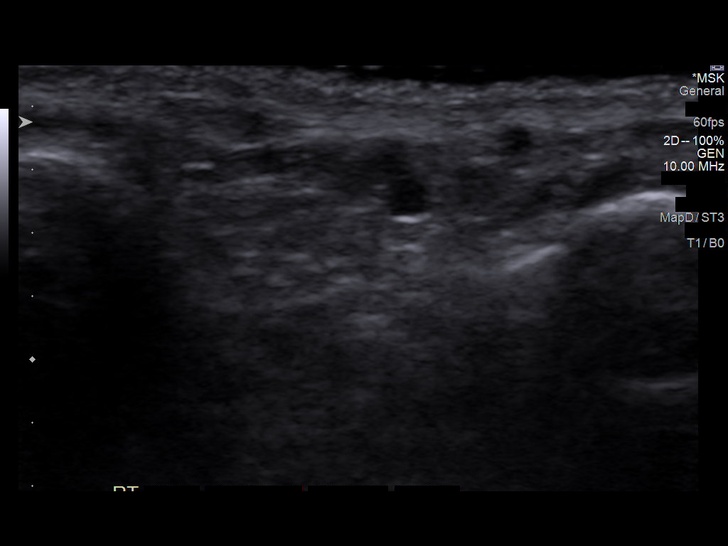
[im 9/10]
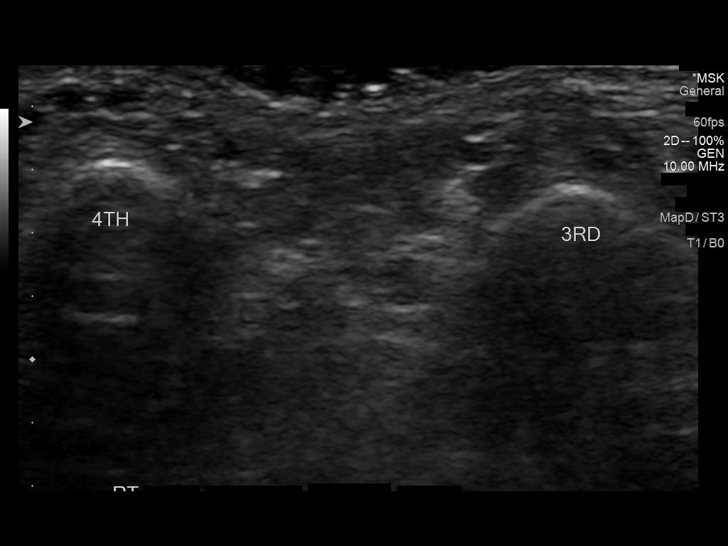
[im 10/10]
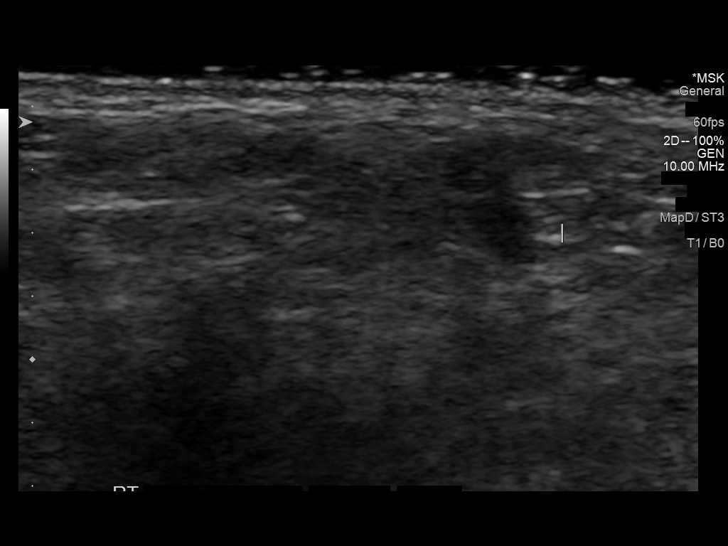

[10 of 10 positions shown; findings below may reference images not displayed]

FINDINGS: Sonographic evaluation of the dorsum of the right foot between the
fourth and fifth digits was performed. There are no focal
sonographic abnormalities identified. No evidence of foreign body.
No fluid collection or abscess.
IMPRESSION: 1. Unremarkable sonographic appearance between the fourth and fifth
digits at the area of concern.
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
## 2022-04-26 ENCOUNTER — Encounter: Payer: Self-pay | Admitting: Physician Assistant

## 2022-04-26 ENCOUNTER — Ambulatory Visit: Payer: BC Managed Care – PPO | Attending: Physician Assistant | Admitting: Cardiology

## 2022-04-26 VITALS — BP 104/60 | HR 68 | Ht 71.0 in | Wt 170.4 lb

## 2022-04-26 DIAGNOSIS — R5383 Other fatigue: Secondary | ICD-10-CM | POA: Diagnosis not present

## 2022-04-26 DIAGNOSIS — R0602 Shortness of breath: Secondary | ICD-10-CM

## 2022-04-26 DIAGNOSIS — I2102 ST elevation (STEMI) myocardial infarction involving left anterior descending coronary artery: Secondary | ICD-10-CM

## 2022-04-26 DIAGNOSIS — Z720 Tobacco use: Secondary | ICD-10-CM

## 2022-04-26 DIAGNOSIS — M79604 Pain in right leg: Secondary | ICD-10-CM

## 2022-04-26 DIAGNOSIS — E785 Hyperlipidemia, unspecified: Secondary | ICD-10-CM

## 2022-04-26 DIAGNOSIS — M79605 Pain in left leg: Secondary | ICD-10-CM

## 2022-04-26 DIAGNOSIS — I2510 Atherosclerotic heart disease of native coronary artery without angina pectoris: Secondary | ICD-10-CM

## 2022-04-26 DIAGNOSIS — R6889 Other general symptoms and signs: Secondary | ICD-10-CM

## 2022-04-26 NOTE — Patient Instructions (Addendum)
Medication Instructions:  Your physician recommends that you continue on your current medications as directed. Please refer to the Current Medication list given to you today.  *If you need a refill on your cardiac medications before your next appointment, please call your pharmacy*   Lab Work: TODAY:  BMET, PRO BNP, & TSH  If you have labs (blood work) drawn today and your tests are completely normal, you will receive your results only by: [place] (if you have MyChart) OR A paper copy in the mail If you have any lab test that is abnormal or we need to change your treatment, we will call you to review the results.   Testing/Procedures: Your physician has requested that you have an echocardiogram. Echocardiography is a painless test that uses sound waves to create images of your heart. It provides your doctor with information about the size and shape of your heart and how well your heart's chambers and valves are working. This procedure takes approximately one hour. There are no restrictions for this procedure. Please do NOT wear cologne, perfume, aftershave, or lotions (deodorant is allowed). Please arrive 15 minutes prior to your appointment time.   Your physician has requested that you have a lexiscan myoview. For further information please visit [email or web address]. Please follow instruction sheet, BELOW:     You are scheduled for a Myocardial Perfusion Imaging Study Please arrive 15 minutes prior to your appointment time for registration and insurance purposes.  The test will take approximately 3 to 4 hours to complete; you may bring reading material.  If someone comes with you to your appointment, they will need to remain in the main lobby due to limited space in the testing area. **If you are pregnant or breastfeeding, please notify the nuclear lab prior to your appointment**  How to prepare for your Myocardial Perfusion Test: Do not eat or drink 3 hours prior to your  test, except you may have water. Do not consume products containing caffeine (regular or decaffeinated) 12 hours prior to your test. (ex: coffee, chocolate, sodas, tea). Do bring a list of your current medications with you.  If not listed below, you may take your medications as normal. Do wear comfortable clothes (no dresses or overalls) and walking shoes, tennis shoes preferred (No heels or open toe shoes are allowed). Do NOT wear cologne, perfume, aftershave, or lotions (deodorant is allowed). If these instructions are not followed, your test will have to be rescheduled.     Follow-Up: At [facility], you and your health needs are our priority.  As part of our continuing mission to provide you with exceptional heart care, we have created designated Provider Care Teams.  These Care Teams include your primary Cardiologist (physician) and Advanced Practice Providers (APPs -  Physician Assistants and Nurse Practitioners) who all work together to provide you with the care you need, when you need it.  We recommend signing up for the patient portal called "MyChart".  Sign up information is provided on this After Visit Summary.  MyChart is used to connect with patients for Virtual Visits (Telemedicine).  Patients are able to view lab/test results, encounter notes, upcoming appointments, etc.  Non-urgent messages can be sent to your provider as well.   To learn more about what you can do with MyChart, go to [email or web address].    Your next appointment:   1ST AVAILABLE   The format for your next appointment:   In Person  Provider:   Sherren Mocha, MD  Other Instructions   Important Information About Sugar

## 2022-04-27 LAB — BASIC METABOLIC PANEL WITH GFR
BUN/Creatinine Ratio: 22 — ABNORMAL HIGH (ref 9–20)
BUN: 17 mg/dL (ref 6–24)
CO2: 21 mmol/L (ref 20–29)
Calcium: 9.1 mg/dL (ref 8.7–10.2)
Chloride: 108 mmol/L — ABNORMAL HIGH (ref 96–106)
Creatinine, Ser: 0.76 mg/dL (ref 0.76–1.27)
Glucose: 110 mg/dL — ABNORMAL HIGH (ref 70–99)
Potassium: 4.3 mmol/L (ref 3.5–5.2)
Sodium: 141 mmol/L (ref 134–144)
eGFR: 104 mL/min/1.73

## 2022-04-27 LAB — PRO B NATRIURETIC PEPTIDE: NT-Pro BNP: 62 pg/mL (ref 0–210)

## 2022-04-27 LAB — TSH: TSH: 0.417 u[IU]/mL — ABNORMAL LOW (ref 0.450–4.500)

## 2022-04-29 ENCOUNTER — Telehealth (HOSPITAL_COMMUNITY): Payer: Self-pay | Admitting: *Deleted

## 2022-04-29 NOTE — Telephone Encounter (Signed)
Spoke with patient and gave detailed instructions. Patient was also asked to arrive 15 minutes earlier prior to his scheduled time.

## 2022-04-30 ENCOUNTER — Ambulatory Visit (HOSPITAL_COMMUNITY): Payer: BC Managed Care – PPO | Attending: Cardiology

## 2022-04-30 DIAGNOSIS — R6889 Other general symptoms and signs: Secondary | ICD-10-CM | POA: Insufficient documentation

## 2022-04-30 DIAGNOSIS — R5383 Other fatigue: Secondary | ICD-10-CM | POA: Diagnosis not present

## 2022-04-30 LAB — MYOCARDIAL PERFUSION IMAGING
Base ST Depression (mm): 0 mm
LV dias vol: 132 mL (ref 62–150)
LV sys vol: 61 mL
Nuc Stress EF: 54 %
Peak HR: 78 {beats}/min
Rest HR: 53 {beats}/min
Rest Nuclear Isotope Dose: 10.5 mCi
SDS: 2
SRS: 0
SSS: 2
ST Depression (mm): 0 mm
Stress Nuclear Isotope Dose: 33 mCi
TID: 0.99

## 2022-04-30 MED ORDER — TECHNETIUM TC 99M TETROFOSMIN IV KIT
10.5000 | PACK | Freq: Once | INTRAVENOUS | Status: AC | PRN
  Administered 2022-04-30: 10.5 via INTRAVENOUS

## 2022-04-30 MED ORDER — TECHNETIUM TC 99M TETROFOSMIN IV KIT
33.0000 | PACK | Freq: Once | INTRAVENOUS | Status: AC | PRN
  Administered 2022-04-30: 33 via INTRAVENOUS

## 2022-05-21 ENCOUNTER — Ambulatory Visit (HOSPITAL_COMMUNITY): Payer: BC Managed Care – PPO | Attending: Cardiology

## 2022-05-21 DIAGNOSIS — R5383 Other fatigue: Secondary | ICD-10-CM | POA: Insufficient documentation

## 2022-05-21 DIAGNOSIS — R6889 Other general symptoms and signs: Secondary | ICD-10-CM | POA: Insufficient documentation

## 2022-05-21 DIAGNOSIS — R0600 Dyspnea, unspecified: Secondary | ICD-10-CM

## 2022-05-21 LAB — ECHOCARDIOGRAM COMPLETE
Area-P 1/2: 1.87 cm2
S' Lateral: 3 cm

## 2022-05-30 ENCOUNTER — Other Ambulatory Visit: Payer: Self-pay | Admitting: Cardiovascular Disease

## 2022-06-02 ENCOUNTER — Other Ambulatory Visit: Payer: Self-pay | Admitting: Cardiovascular Disease

## 2022-06-02 ENCOUNTER — Other Ambulatory Visit: Payer: Self-pay

## 2022-06-02 DIAGNOSIS — I2102 ST elevation (STEMI) myocardial infarction involving left anterior descending coronary artery: Secondary | ICD-10-CM

## 2022-06-02 DIAGNOSIS — R079 Chest pain, unspecified: Secondary | ICD-10-CM

## 2022-06-02 MED ORDER — ASPIRIN 81 MG PO TBEC: DELAYED_RELEASE_TABLET | ORAL | 1 refills | Status: DC

## 2022-06-03 ENCOUNTER — Other Ambulatory Visit: Payer: Self-pay | Admitting: Family Medicine

## 2022-06-03 ENCOUNTER — Telehealth: Payer: Self-pay

## 2022-06-03 MED ORDER — ALPRAZOLAM 0.5 MG PO TABS: ORAL_TABLET | ORAL | 0 refills | Status: DC

## 2022-06-03 NOTE — Telephone Encounter (Signed)
Pt called requesting a refill on his Xanax 0.5 mg. Last RF was 02/18/2022. Thank you.

## 2022-06-03 NOTE — Telephone Encounter (Signed)
Pharmacy sent eFax to request a new prescription for   ALPRAZolam Duanne Moron) 0.5 MG tablet   Pharmacy info:  [id number]- [place], [place] - [address], [id number]: [phone] Fax: [phone] #: [id number]   Please advise pharmacist.

## 2022-09-08 ENCOUNTER — Telehealth: Payer: Self-pay

## 2022-09-08 NOTE — Telephone Encounter (Signed)
Pt called in to get a refill of this med ALPRAZolam Prudy Feeler) 0.5 MG tablet [[id number]], and to also ask pcp if he would send in a med to help him stop smoking.    LOV: 02/18/22  PHARMACY:  [id number] - [place], [place] - [address]   CB#: [phone]

## 2022-09-09 ENCOUNTER — Encounter: Payer: Self-pay | Admitting: Family Medicine

## 2022-09-09 ENCOUNTER — Other Ambulatory Visit: Payer: Self-pay | Admitting: Family Medicine

## 2022-09-09 ENCOUNTER — Ambulatory Visit: Payer: BC Managed Care – PPO | Admitting: Family Medicine

## 2022-09-09 VITALS — BP 126/72 | HR 81 | Temp 97.6°F | Ht 71.0 in | Wt 166.0 lb

## 2022-09-09 DIAGNOSIS — R1032 Left lower quadrant pain: Secondary | ICD-10-CM | POA: Diagnosis not present

## 2022-09-09 MED ORDER — ALPRAZOLAM 0.5 MG PO TABS: ORAL_TABLET | ORAL | 0 refills | Status: DC

## 2022-09-09 MED ORDER — AMOXICILLIN-POT CLAVULANATE 875-125 MG PO TABS: 1.0000 | ORAL_TABLET | Freq: Two times a day (BID) | ORAL | 0 refills | Status: DC
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
## 2022-09-10 ENCOUNTER — Other Ambulatory Visit: Payer: Self-pay | Admitting: Family Medicine

## 2022-09-10 LAB — COMPLETE METABOLIC PANEL WITH GFR
AG Ratio: 1.9 (calc) (ref 1.0–2.5)
ALT: 27 U/L (ref 9–46)
AST: 26 U/L (ref 10–35)
Albumin: 4.5 g/dL (ref 3.6–5.1)
Alkaline phosphatase (APISO): 54 U/L (ref 35–144)
BUN: 20 mg/dL (ref 7–25)
CO2: 28 mmol/L (ref 20–32)
Calcium: 9.8 mg/dL (ref 8.6–10.3)
Chloride: 106 mmol/L (ref 98–110)
Creat: 0.86 mg/dL (ref 0.70–1.35)
Globulin: 2.4 g/dL (calc) (ref 1.9–3.7)
Glucose, Bld: 94 mg/dL (ref 65–99)
Potassium: 4.3 mmol/L (ref 3.5–5.3)
Sodium: 140 mmol/L (ref 135–146)
Total Bilirubin: 0.7 mg/dL (ref 0.2–1.2)
Total Protein: 6.9 g/dL (ref 6.1–8.1)
eGFR: 99 mL/min/{1.73_m2} (ref >=60)

## 2022-09-10 LAB — CBC WITH DIFFERENTIAL/PLATELET
Absolute Monocytes: 593 cells/uL (ref 200–950)
Basophils Absolute: 47 cells/uL (ref 0–200)
Basophils Relative: 0.6 %
Eosinophils Absolute: 142 cells/uL (ref 15–500)
Eosinophils Relative: 1.8 %
HCT: 42.8 % (ref 38.5–50.0)
Hemoglobin: 14.4 g/dL (ref 13.2–17.1)
Lymphs Abs: 2125 cells/uL (ref 850–3900)
MCH: 32.6 pg (ref 27.0–33.0)
MCHC: 33.6 g/dL (ref 32.0–36.0)
MCV: 96.8 fL (ref 80.0–100.0)
MPV: 11.6 fL (ref 7.5–12.5)
Monocytes Relative: 7.5 %
Neutro Abs: 4993 cells/uL (ref 1500–7800)
Neutrophils Relative %: 63.2 %
Platelets: 227 10*3/uL (ref 140–400)
RBC: 4.42 10*6/uL (ref 4.20–5.80)
RDW: 12 % (ref 11.0–15.0)
Total Lymphocyte: 26.9 %
WBC: 7.9 10*3/uL (ref 3.8–10.8)

## 2022-09-10 MED ORDER — NICOTINE 21 MG/24HR TD PT24: 21.0000 mg | MEDICATED_PATCH | Freq: Every day | TRANSDERMAL | 0 refills | Status: DC

## 2022-09-10 NOTE — Progress Notes (Signed)
Nicoderm

## 2022-09-16 ENCOUNTER — Other Ambulatory Visit: Payer: Self-pay | Admitting: Cardiovascular Disease

## 2022-09-16 ENCOUNTER — Other Ambulatory Visit: Payer: Self-pay | Admitting: Family Medicine

## 2022-09-16 DIAGNOSIS — R079 Chest pain, unspecified: Secondary | ICD-10-CM

## 2022-09-16 DIAGNOSIS — I2102 ST elevation (STEMI) myocardial infarction involving left anterior descending coronary artery: Secondary | ICD-10-CM

## 2022-09-16 MED ORDER — NEXLIZET 180-10 MG PO TABS: 1.0000 | ORAL_TABLET | Freq: Every day | ORAL | 2 refills | Status: DC

## 2022-09-19 ENCOUNTER — Other Ambulatory Visit: Payer: Self-pay | Admitting: Family Medicine

## 2022-09-19 ENCOUNTER — Other Ambulatory Visit: Payer: Self-pay | Admitting: Cardiovascular Disease

## 2022-09-20 ENCOUNTER — Telehealth: Payer: Self-pay | Admitting: Cardiovascular Disease

## 2022-09-20 NOTE — Telephone Encounter (Signed)
Malena Peer D, RPH-CPP   11/17/21 10:25 AM Note ApoB is close to goal of <80. Patient was intolerant to Repatha, simvastatin 40mg  daily, atorvastatin 20mg , pravastatin 20mg , pravastatin 40mg , ezetimibe 10mg  daily (leg cramps), rosuvastatin 20mg  daily (leg weakness, elevated CK).    Doing well on Nexletol. Patient doesn't remember trying zetia. Not interested in [place]. He is willing to try zetia. Will Rx separate from [facility] at first to see if pt tolerates. Repeat labs in 2 months.      Olene Floss, RPH-CPP   01/20/22  9:34 AM Note Spoke with patient and reviewed lab results. He wishes to combine Nexletol and zetia into one pill. Will send Rx for Nexlizet. ApoB at goal.     Will route to PharmD team. Assume that he means to split up again.

## 2022-09-20 NOTE — Telephone Encounter (Signed)
Pt c/o medication issue:  1. Name of Medication: Bempedoic Acid-Ezetimibe (NEXLIZET) 180-10 MG TABS   2. How are you currently taking this medication (dosage and times per day)?   3. Are you having a reaction (difficulty breathing--STAT)?   4. What is your medication issue? Patient states that he needs the other brand of this medication called in due to insurance issue getting this medication.

## 2022-09-20 NOTE — Telephone Encounter (Signed)
Pt pharmacy requesting refill not prescribed by Dr Excell Seltzer. Please address. Thank you

## 2022-09-21 ENCOUNTER — Telehealth: Payer: Self-pay

## 2022-09-21 ENCOUNTER — Other Ambulatory Visit (HOSPITAL_COMMUNITY): Payer: Self-pay

## 2022-09-21 MED ORDER — NEXLIZET 180-10 MG PO TABS: 1.0000 | ORAL_TABLET | Freq: Every day | ORAL | 3 refills | Status: DC

## 2022-09-21 NOTE — Addendum Note (Signed)
Addended by: Malena Peer D on: 09/21/2022 04:30 PM   Modules accepted: Orders

## 2022-09-21 NOTE — Telephone Encounter (Signed)
Pharmacy Patient Advocate Encounter   Received notification from [facility] that prior authorization for NEXLIZET is needed.    PA submitted on 09/21/22 Key [id number] Status is pending  Haze Rushing, CPhT Pharmacy Patient Advocate Specialist Direct Number: [phone] Fax: [phone]

## 2022-09-21 NOTE — Telephone Encounter (Signed)
Pharmacy Patient Advocate Encounter  Prior Authorization for NEZLIZET has been approved.    Effective dates: 09/21/22 through 09/21/23   Haze Rushing, CPhT Pharmacy Patient Advocate Specialist Direct Number: [phone] Fax: [phone]

## 2022-09-21 NOTE — Telephone Encounter (Signed)
Pt made aware PA approved. Shuld be able to fill now. Advised to stop zetia when he starts combo piull of Nexlizet. Copay card for nexletol should work on nexlizet.

## 2022-09-30 ENCOUNTER — Other Ambulatory Visit: Payer: Self-pay

## 2022-09-30 MED ORDER — NEXLETOL 180 MG PO TABS
1.0000 | ORAL_TABLET | Freq: Every day | ORAL | 11 refills | Status: DC
  Filled 2022-09-30: qty 30, 30d supply, fill #0

## 2022-10-01 ENCOUNTER — Other Ambulatory Visit: Payer: Self-pay

## 2022-10-05 ENCOUNTER — Other Ambulatory Visit: Payer: Self-pay

## 2022-11-18 ENCOUNTER — Telehealth: Payer: Self-pay | Admitting: *Deleted

## 2022-11-18 ENCOUNTER — Encounter: Payer: Self-pay | Admitting: Cardiovascular Disease

## 2022-11-18 ENCOUNTER — Ambulatory Visit: Payer: BC Managed Care – PPO | Attending: Cardiovascular Disease | Admitting: Cardiovascular Disease

## 2022-11-18 VITALS — BP 110/70 | HR 65 | Ht 71.0 in | Wt 160.0 lb

## 2022-11-18 DIAGNOSIS — E782 Mixed hyperlipidemia: Secondary | ICD-10-CM | POA: Diagnosis not present

## 2022-11-18 DIAGNOSIS — I2510 Atherosclerotic heart disease of native coronary artery without angina pectoris: Secondary | ICD-10-CM | POA: Diagnosis not present

## 2022-11-18 MED ORDER — NEXLIZET 180-10 MG PO TABS: 1.0000 | ORAL_TABLET | Freq: Every day | ORAL | 3 refills | Status: DC
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
## 2022-11-18 NOTE — Patient Instructions (Signed)
Medication Instructions:  Your physician recommends that you continue on your current medications as directed. Please refer to the Current Medication list given to you today.  *If you need a refill on your cardiac medications before your next appointment, please call your pharmacy*   Lab Work: NONE If you have labs (blood work) drawn today and your tests are completely normal, you will receive your results only by: MyChart Message (if you have MyChart) OR A paper copy in the mail If you have any lab test that is abnormal or we need to change your treatment, we will call you to review the results.   Testing/Procedures: NONE   Follow-Up: At [facility], you and your health needs are our priority.  As part of our continuing mission to provide you with exceptional heart care, we have created designated Provider Care Teams.  These Care Teams include your primary Cardiologist (physician) and Advanced Practice Providers (APPs -  Physician Assistants and Nurse Practitioners) who all work together to provide you with the care you need, when you need it.  We recommend signing up for the patient portal called "MyChart".  Sign up information is provided on this After Visit Summary.  MyChart is used to connect with patients for Virtual Visits (Telemedicine).  Patients are able to view lab/test results, encounter notes, upcoming appointments, etc.  Non-urgent messages can be sent to your provider as well.   To learn more about what you can do with MyChart, go to [email or web address].    Your next appointment:   1 year(s)  Provider:   Michael Cooper, MD      

## 2022-11-19 NOTE — Telephone Encounter (Signed)
Blank message routed, not clear why. Called pt to see if he needed anything. Left message to call back if anything is needed.

## 2022-11-19 NOTE — Telephone Encounter (Signed)
Pt returned call, didn't listen to voicemail, doesn't need anything.

## 2022-12-15 ENCOUNTER — Other Ambulatory Visit: Payer: Self-pay | Admitting: Family Medicine

## 2022-12-16 NOTE — Telephone Encounter (Signed)
Last OV 09/09/22 Requested Prescriptions  Pending Prescriptions Disp Refills   nicotine (NICODERM CQ - DOSED IN MG/24 HOURS) 21 mg/24hr patch [Pharmacy Med Name: NICOTINE 21 MG/24HR PATCH] 28 patch 1    Sig: PLACE 1 PATCH ONTO THE SKIN DAILY.     Psychiatry:  Drug Dependence Therapy Failed - 12/15/2022  9:09 PM      Failed - Valid encounter within last 12 months    Recent Outpatient Visits           1 year ago Morbilliform rash   [facility] Tanya Nones, Priscille Heidelberg, MD   2 years ago ASCVD (arteriosclerotic cardiovascular disease)   [facility] Pickard, Priscille Heidelberg, MD   2 years ago Colon cancer screening   [facility] Tanya Nones, Priscille Heidelberg, MD   4 years ago Left sided sciatica   [facility] Tanya Nones, Priscille Heidelberg, MD   4 years ago Pure hypercholesterolemia   [facility] Pickard, Priscille Heidelberg, MD               ALPRAZolam Prudy Feeler) 0.5 MG tablet [Pharmacy Med Name: ALPRAZOLAM 0.5 MG TABLET] 90 tablet 0    Sig: TAKE 1 TABLET BY MOUTH THREE TIMES A DAY AS NEEDED FOR ANXIETY     Not Delegated - Psychiatry: Anxiolytics/Hypnotics 2 Failed - 12/15/2022  9:09 PM      Failed - This refill cannot be delegated      Failed - Urine Drug Screen completed in last 360 days      Failed - Valid encounter within last 6 months    Recent Outpatient Visits           1 year ago Morbilliform rash   [facility] Donita Brooks, MD   2 years ago ASCVD (arteriosclerotic cardiovascular disease)   [facility] Donita Brooks, MD   2 years ago Colon cancer screening   [facility] Donita Brooks, MD   4 years ago Left sided sciatica   [facility] Pickard, Priscille Heidelberg, MD   4 years ago Pure hypercholesterolemia   [facility] Pickard, Priscille Heidelberg, MD              Passed - Patient is not pregnant

## 2022-12-16 NOTE — Telephone Encounter (Signed)
Requested medication (s) are due for refill today: Yes  Requested medication (s) are on the active medication list: Yes  Last refill:  09/09/22  Future visit scheduled: No  Notes to clinic:  Unable to refill per protocol, cannot delegate.      Requested Prescriptions  Pending Prescriptions Disp Refills   ALPRAZolam (XANAX) 0.5 MG tablet [Pharmacy Med Name: ALPRAZOLAM 0.5 MG TABLET] 90 tablet 0    Sig: TAKE 1 TABLET BY MOUTH THREE TIMES A DAY AS NEEDED FOR ANXIETY     Not Delegated - Psychiatry: Anxiolytics/Hypnotics 2 Failed - 12/15/2022  9:09 PM      Failed - This refill cannot be delegated      Failed - Urine Drug Screen completed in last 360 days      Failed - Valid encounter within last 6 months    Recent Outpatient Visits           1 year ago Morbilliform rash   [facility] Donita Brooks, MD   2 years ago ASCVD (arteriosclerotic cardiovascular disease)   [facility] Pick[facility], Priscille Heidelberg, MD   2 years ago Colon cancer screening   [facility] Donita Brooks, MD   4 years ago Left sided sciatica   [facility] Tanya Nones, Priscille Heidelberg, MD   4 years ago Pure hypercholesterolemia   [facility] Pickard, Priscille Heidelberg, MD              Passed - Patient is not pregnant      Signed Prescriptions Disp Refills   nicotine (NICODERM CQ - DOSED IN MG/24 HOURS) 21 mg/24hr patch 28 patch 1    Sig: PLACE 1 PATCH ONTO THE SKIN DAILY.     Psychiatry:  Drug Dependence Therapy Failed - 12/15/2022  9:09 PM      Failed - Valid encounter within last 12 months    Recent Outpatient Visits           1 year ago Morbilliform rash   [facility] Tanya Nones, Priscille Heidelberg, MD   2 years ago ASCVD (arteriosclerotic cardiovascular disease)   [facility] Pickard, Priscille Heidelberg, MD   2 years ago Colon cancer screening   [facility] Donita Brooks, MD   4 years ago Left sided  sciatica   [facility] Pickard, Priscille Heidelberg, MD   4 years ago Pure hypercholesterolemia   [facility] Pickard, Priscille Heidelberg, MD

## 2023-02-14 ENCOUNTER — Ambulatory Visit: Payer: BC Managed Care – PPO | Admitting: Cardiovascular Disease

## 2023-04-29 ENCOUNTER — Telehealth: Payer: Self-pay

## 2023-04-29 ENCOUNTER — Other Ambulatory Visit: Payer: Self-pay | Admitting: Family Medicine

## 2023-04-29 DIAGNOSIS — I2102 ST elevation (STEMI) myocardial infarction involving left anterior descending coronary artery: Secondary | ICD-10-CM

## 2023-04-29 DIAGNOSIS — R079 Chest pain, unspecified: Secondary | ICD-10-CM

## 2023-04-29 MED ORDER — ALPRAZOLAM 0.5 MG PO TABS: ORAL_TABLET | ORAL | 0 refills | Status: DC

## 2023-04-29 NOTE — Telephone Encounter (Signed)
Prescription Request  04/29/2023  LOV: 09/09/22  What is the name of the medication or equipment? aspirin EC 81 MG tablet [[id number]]   Have you contacted your pharmacy to request a refill? Yes   Which pharmacy would you like this sent to?  [id number] - [place],  - [address] AT [facility] [address] [place] [address] Phone: [phone] Fax: [phone]    Patient notified that their request is being sent to the clinical staff for review and that they should receive a response within 2 business days.   Please advise at [facility] [phone]

## 2023-04-29 NOTE — Telephone Encounter (Signed)
Copied from CRM [id number]. Topic: Clinical - Medication Refill >> Apr 29, 2023  3:20 PM Antony Haste wrote: Most Recent Primary Care Visit:  Provider: Lynnea Ferrier T  Department: [facility]  Visit Type: OFFICE VISIT  Date: 09/09/2022  Medication: ALPRAZolam Prudy Feeler) 0.5 MG tablet  Has the patient contacted their pharmacy? Yes (Agent: If no, request that the patient contact the pharmacy for the refill. If patient does not wish to contact the pharmacy document the reason why and proceed with request.) (Agent: If yes, when and what did the pharmacy advise?)  Is this the correct pharmacy for this prescription? Yes If no, delete pharmacy and type the correct one.  This is the patient's preferred pharmacy:  [id number] [place], [place] - [address] AT [facility] [address] [place] [address] Phone: [phone] Fax: [phone]   Has the prescription been filled recently? No  Is the patient out of the medication? Yes  Has the patient been seen for an appointment in the last year OR does the patient have an upcoming appointment? Yes  Can we respond through MyChart? Yes  Agent: Please be advised that Rx refills may take up to 3 business days. We ask that you follow-up with your pharmacy.

## 2023-05-02 NOTE — Telephone Encounter (Addendum)
  Sent pt message to call for appt. Pt given 30 day courtesy refill. Needs appointment for further refills   Requested Prescriptions  Pending Prescriptions Disp Refills   aspirin EC 81 MG tablet 30 tablet 0    Sig: TAKE 1 TABLET BY MOUTH EVERY DAY SWALLOW WHOLE     Analgesics:  NSAIDS - aspirin Failed - 05/02/2023 12:19 PM      Failed - Valid encounter within last 12 months    Recent Outpatient Visits           1 year ago Morbilliform rash   [facility] Tanya Nones, Priscille Heidelberg, MD   2 years ago ASCVD (arteriosclerotic cardiovascular disease)   [facility] Pickard, Priscille Heidelberg, MD   3 years ago Colon cancer screening   [facility] Pickard, Priscille Heidelberg, MD   4 years ago Left sided sciatica   [facility] Donita Brooks, MD   4 years ago Pure hypercholesterolemia   [facility] Pickard, Priscille Heidelberg, MD              Passed - Cr in normal range and within 360 days    Creat  Date Value Ref Range Status  09/09/2022 0.86 0.70 - 1.35 mg/dL Final         Passed - eGFR is 10 or above and within 360 days    GFR, Est African American  Date Value Ref Range Status  12/31/2019 117 > OR = 60 mL/min/1.23m2 Final   GFR, Est Non African American  Date Value Ref Range Status  12/31/2019 101 > OR = 60 mL/min/1.23m2 Final   GFR, Estimated  Date Value Ref Range Status  04/04/2022 >60 >60 mL/min Final    Comment:    (NOTE) Calculated using the CKD-EPI Creatinine Equation (2021)    GFR  Date Value Ref Range Status  11/02/2011 115.68 >60.00 mL/min Final   eGFR  Date Value Ref Range Status  09/09/2022 99 > OR = 60 mL/min/1.74m2 Final  04/26/2022 104 >59 mL/min/1.73 Final         Passed - Patient is not pregnant

## 2023-05-24 ENCOUNTER — Ambulatory Visit (INDEPENDENT_AMBULATORY_CARE_PROVIDER_SITE_OTHER): Payer: BC Managed Care – PPO | Admitting: Family Medicine

## 2023-05-24 ENCOUNTER — Other Ambulatory Visit: Payer: Self-pay | Admitting: Cardiovascular Disease

## 2023-05-24 VITALS — BP 120/74 | HR 75 | Temp 97.9°F | Ht 71.0 in | Wt 164.4 lb

## 2023-05-24 DIAGNOSIS — Z0001 Encounter for general adult medical examination with abnormal findings: Secondary | ICD-10-CM | POA: Diagnosis not present

## 2023-05-24 DIAGNOSIS — Z125 Encounter for screening for malignant neoplasm of prostate: Secondary | ICD-10-CM | POA: Diagnosis not present

## 2023-05-24 DIAGNOSIS — E7800 Pure hypercholesterolemia, unspecified: Secondary | ICD-10-CM

## 2023-05-24 DIAGNOSIS — Z23 Encounter for immunization: Secondary | ICD-10-CM | POA: Diagnosis not present

## 2023-05-24 DIAGNOSIS — R7989 Other specified abnormal findings of blood chemistry: Secondary | ICD-10-CM | POA: Diagnosis not present

## 2023-05-24 DIAGNOSIS — I2510 Atherosclerotic heart disease of native coronary artery without angina pectoris: Secondary | ICD-10-CM

## 2023-05-24 DIAGNOSIS — Z122 Encounter for screening for malignant neoplasm of respiratory organs: Secondary | ICD-10-CM

## 2023-05-24 DIAGNOSIS — Z0000 Encounter for general adult medical examination without abnormal findings: Secondary | ICD-10-CM

## 2023-05-24 MED ORDER — TADALAFIL 20 MG PO TABS: 10.0000 mg | ORAL_TABLET | ORAL | 11 refills | Status: DC | PRN
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
## 2023-05-25 ENCOUNTER — Other Ambulatory Visit: Payer: Self-pay | Admitting: Family Medicine

## 2023-05-25 ENCOUNTER — Other Ambulatory Visit: Payer: Self-pay | Admitting: Cardiovascular Disease

## 2023-05-25 DIAGNOSIS — R079 Chest pain, unspecified: Secondary | ICD-10-CM

## 2023-05-25 DIAGNOSIS — I2102 ST elevation (STEMI) myocardial infarction involving left anterior descending coronary artery: Secondary | ICD-10-CM

## 2023-05-25 LAB — COMPLETE METABOLIC PANEL WITH GFR
AG Ratio: 1.8 (calc) (ref 1.0–2.5)
ALT: 26 U/L (ref 9–46)
AST: 27 U/L (ref 10–35)
Albumin: 4.4 g/dL (ref 3.6–5.1)
Alkaline phosphatase (APISO): 54 U/L (ref 35–144)
BUN: 24 mg/dL (ref 7–25)
CO2: 26 mmol/L (ref 20–32)
Calcium: 9.7 mg/dL (ref 8.6–10.3)
Chloride: 105 mmol/L (ref 98–110)
Creat: 0.82 mg/dL (ref 0.70–1.35)
Globulin: 2.5 g/dL (ref 1.9–3.7)
Glucose, Bld: 91 mg/dL (ref 65–99)
Potassium: 4.4 mmol/L (ref 3.5–5.3)
Sodium: 140 mmol/L (ref 135–146)
Total Bilirubin: 0.7 mg/dL (ref 0.2–1.2)
Total Protein: 6.9 g/dL (ref 6.1–8.1)
eGFR: 101 mL/min/{1.73_m2} (ref >=60)

## 2023-05-25 LAB — CBC WITH DIFFERENTIAL/PLATELET
Absolute Lymphocytes: 2460 {cells}/uL (ref 850–3900)
Absolute Monocytes: 758 {cells}/uL (ref 200–950)
Basophils Absolute: 53 {cells}/uL (ref 0–200)
Basophils Relative: 0.7 %
Eosinophils Absolute: 180 {cells}/uL (ref 15–500)
Eosinophils Relative: 2.4 %
HCT: 42.6 % (ref 38.5–50.0)
Hemoglobin: 14.5 g/dL (ref 13.2–17.1)
MCH: 33.4 pg — ABNORMAL HIGH (ref 27.0–33.0)
MCHC: 34 g/dL (ref 32.0–36.0)
MCV: 98.2 fL (ref 80.0–100.0)
MPV: 11.6 fL (ref 7.5–12.5)
Monocytes Relative: 10.1 %
Neutro Abs: 4050 {cells}/uL (ref 1500–7800)
Neutrophils Relative %: 54 %
Platelets: 237 10*3/uL (ref 140–400)
RBC: 4.34 10*6/uL (ref 4.20–5.80)
RDW: 11.6 % (ref 11.0–15.0)
Total Lymphocyte: 32.8 %
WBC: 7.5 10*3/uL (ref 3.8–10.8)

## 2023-05-25 LAB — LIPID PANEL
Cholesterol: 165 mg/dL (ref <200)
HDL: 67 mg/dL (ref >=40)
LDL Cholesterol (Calc): 84 mg/dL
Non-HDL Cholesterol (Calc): 98 mg/dL (ref <130)
Total CHOL/HDL Ratio: 2.5 (calc) (ref <5.0)
Triglycerides: 63 mg/dL (ref <150)

## 2023-05-25 LAB — PSA: PSA: 0.37 ng/mL (ref <=4.00)

## 2023-05-25 LAB — TSH: TSH: 0.42 m[IU]/L (ref 0.40–4.50)

## 2023-05-25 NOTE — Telephone Encounter (Signed)
 Prescription Request  05/25/2023  LOV: 05/24/2023  What is the name of the medication or equipment?   aspirin  EC 81 MG tablet  **90 day script request**  Have you contacted your pharmacy to request a refill? Yes   Which pharmacy would you like this sent to?  [id number] [place], [place] - [address] AT [facility] [address] [place] [address] Phone: [phone] Fax: [phone]    Patient notified that their request is being sent to the clinical staff for review and that they should receive a response within 2 business days.   Please advise pharmacist.

## 2023-05-26 ENCOUNTER — Other Ambulatory Visit: Payer: Self-pay

## 2023-05-26 MED ORDER — ASPIRIN 81 MG PO TBEC: DELAYED_RELEASE_TABLET | ORAL | 1 refills | Status: DC

## 2023-05-26 MED ORDER — NEXLIZET 180-10 MG PO TABS: 1.0000 | ORAL_TABLET | Freq: Every day | ORAL | 1 refills | Status: DC

## 2023-05-26 NOTE — Telephone Encounter (Signed)
OV 05/24/23 Requested Prescriptions  Pending Prescriptions Disp Refills   aspirin EC 81 MG tablet 90 tablet 1    Sig: TAKE 1 TABLET BY MOUTH EVERY DAY SWALLOW WHOLE     Analgesics:  NSAIDS - aspirin Failed - 05/26/2023  9:42 AM      Failed - Valid encounter within last 12 months    Recent Outpatient Visits           1 year ago Morbilliform rash   [facility] Donita Brooks, MD   2 years ago ASCVD (arteriosclerotic cardiovascular disease)   [facility] Pickard, Priscille Heidelberg, MD   3 years ago Colon cancer screening   [facility] Pickard, Priscille Heidelberg, MD   4 years ago Left sided sciatica   [facility] Tanya Nones, Priscille Heidelberg, MD   4 years ago Pure hypercholesterolemia   [facility] Pickard, Priscille Heidelberg, MD              Passed - Cr in normal range and within 360 days    Creat  Date Value Ref Range Status  05/24/2023 0.82 0.70 - 1.35 mg/dL Final         Passed - eGFR is 10 or above and within 360 days    GFR, Est African American  Date Value Ref Range Status  12/31/2019 117 > OR = 60 mL/min/1.17m2 Final   GFR, Est Non African American  Date Value Ref Range Status  12/31/2019 101 > OR = 60 mL/min/1.42m2 Final   GFR, Estimated  Date Value Ref Range Status  04/04/2022 >60 >60 mL/min Final    Comment:    (NOTE) Calculated using the CKD-EPI Creatinine Equation (2021)    GFR  Date Value Ref Range Status  11/02/2011 115.68 >60.00 mL/min Final   eGFR  Date Value Ref Range Status  05/24/2023 101 > OR = 60 mL/min/1.101m2 Final  04/26/2022 104 >59 mL/min/1.73 Final         Passed - Patient is not pregnant

## 2023-05-27 ENCOUNTER — Other Ambulatory Visit: Payer: Self-pay | Admitting: Family Medicine

## 2023-05-27 MED ORDER — SILDENAFIL CITRATE 100 MG PO TABS: 50.0000 mg | ORAL_TABLET | Freq: Every day | ORAL | 11 refills | Status: DC | PRN

## 2023-06-09 ENCOUNTER — Encounter: Payer: Self-pay | Admitting: Family Medicine

## 2023-06-17 ENCOUNTER — Ambulatory Visit
Admission: RE | Admit: 2023-06-17 | Discharge: 2023-06-17 | Disposition: A | Discharge status: Home / Self Care | Payer: BC Managed Care – PPO | Source: Ambulatory Visit | Attending: Family Medicine | Admitting: Family Medicine

## 2023-06-17 DIAGNOSIS — F17210 Nicotine dependence, cigarettes, uncomplicated: Secondary | ICD-10-CM | POA: Diagnosis not present

## 2023-06-17 DIAGNOSIS — Z122 Encounter for screening for malignant neoplasm of respiratory organs: Secondary | ICD-10-CM

## 2023-06-23 ENCOUNTER — Telehealth: Payer: Self-pay

## 2023-06-23 DIAGNOSIS — L821 Other seborrheic keratosis: Secondary | ICD-10-CM | POA: Diagnosis not present

## 2023-06-23 DIAGNOSIS — L814 Other melanin hyperpigmentation: Secondary | ICD-10-CM | POA: Diagnosis not present

## 2023-06-23 NOTE — Telephone Encounter (Signed)
Copied from CRM [id number]. Topic: Clinical - Lab/Test Results >> Jun 23, 2023  9:55 AM Gery Pray wrote: Reason for CRM: Patient called in regards to labs completed on 01/14 for lung cancer. Patient can be reached at [phone]

## 2023-08-02 ENCOUNTER — Other Ambulatory Visit: Payer: Self-pay | Admitting: Family Medicine

## 2023-08-03 NOTE — Telephone Encounter (Signed)
 Requested medication (s) are due for refill today - yes  Requested medication (s) are on the active medication list -yes  Future visit scheduled -no  Last refill: 04/29/23 #90  Notes to clinic:  non delegated Rx  Requested Prescriptions  Pending Prescriptions Disp Refills   ALPRAZolam (XANAX) 0.5 MG tablet [Pharmacy Med Name: ALPRAZOLAM 0.5 MG TABLET] 90 tablet 0    Sig: TAKE 1 TABLET BY MOUTH THREE TIMES A DAY AS NEEDED FOR ANXIETY     Not Delegated - Psychiatry: Anxiolytics/Hypnotics 2 Failed - 08/03/2023  3:29 PM      Failed - This refill cannot be delegated      Failed - Urine Drug Screen completed in last 360 days      Failed - Valid encounter within last 6 months    Recent Outpatient Visits           2 months ago ASCVD (arteriosclerotic cardiovascular disease)   [facility] Donita Brooks, MD   10 months ago LLQ abdominal pain   [place] [facility] Pickard, Priscille Heidelberg, MD   1 year ago Prostate cancer screening   [place] [facility] Donita Brooks, MD   8 years ago Axillary pain, right   [facility] at [facility], Marolyn Hammock, PA-C   8 years ago Axillary pain, right   [facility] at [facility], Marolyn Hammock, PA-C              Passed - Patient is not pregnant         Requested Prescriptions  Pending Prescriptions Disp Refills   ALPRAZolam (XANAX) 0.5 MG tablet [Pharmacy Med Name: ALPRAZOLAM 0.5 MG TABLET] 90 tablet 0    Sig: TAKE 1 TABLET BY MOUTH THREE TIMES A DAY AS NEEDED FOR ANXIETY     Not Delegated - Psychiatry: Anxiolytics/Hypnotics 2 Failed - 08/03/2023  3:29 PM      Failed - This refill cannot be delegated      Failed - Urine Drug Screen completed in last 360 days      Failed - Valid encounter within last 6 months    Recent Outpatient Visits           2 months ago ASCVD (arteriosclerotic cardiovascular disease)   [place] [facility] Donita Brooks, MD   10 months ago LLQ abdominal pain   [place] [facility] Tanya Nones, Priscille Heidelberg, MD   1 year ago Prostate cancer screening   [place] [facility] Donita Brooks, MD   8 years ago Axillary pain, right   [facility] at [facility], Marolyn Hammock, PA-C   8 years ago Axillary pain, right   [facility], Marolyn Hammock, PA-C              Passed - Patient is not pregnant

## 2023-08-12 ENCOUNTER — Ambulatory Visit: Payer: BC Managed Care – PPO

## 2023-08-19 ENCOUNTER — Ambulatory Visit

## 2023-08-22 ENCOUNTER — Other Ambulatory Visit: Payer: Self-pay | Admitting: Cardiovascular Disease

## 2023-08-26 ENCOUNTER — Ambulatory Visit

## 2023-09-09 ENCOUNTER — Telehealth: Payer: Self-pay | Admitting: Pharmacy Technician

## 2023-09-09 ENCOUNTER — Other Ambulatory Visit (HOSPITAL_COMMUNITY): Payer: Self-pay

## 2023-09-09 NOTE — Telephone Encounter (Signed)
 Pharmacy Patient Advocate Encounter  Received notification from [facility] that Prior Authorization for NEXLIZET  has been APPROVED from 09/21/22 to 09/21/23   PA #/Case ID/Reference #: tried for a pa again and it says pa approval on file     [facility] [phone]. [facility] and they filled it for 75.00 for 90 days and be ready monday

## 2023-09-16 ENCOUNTER — Ambulatory Visit (INDEPENDENT_AMBULATORY_CARE_PROVIDER_SITE_OTHER)

## 2023-09-16 DIAGNOSIS — Z23 Encounter for immunization: Secondary | ICD-10-CM | POA: Diagnosis not present

## 2023-09-16 NOTE — Progress Notes (Signed)
 Patient is in office today for a nurse visit for Immunization. Patient Injection was given in the  Left deltoid. Patient tolerated injection well.

## 2023-09-20 ENCOUNTER — Other Ambulatory Visit: Payer: Self-pay | Admitting: Cardiovascular Disease

## 2023-09-20 MED ORDER — METOPROLOL SUCCINATE ER 25 MG PO TB24: 12.5000 mg | ORAL_TABLET | Freq: Every day | ORAL | 0 refills | Status: DC

## 2023-09-21 ENCOUNTER — Telehealth: Payer: Self-pay | Admitting: Pharmacy Technician

## 2023-09-21 ENCOUNTER — Other Ambulatory Visit (HOSPITAL_COMMUNITY): Payer: Self-pay

## 2023-09-21 NOTE — Telephone Encounter (Signed)
 RX sent to [facility].

## 2023-09-21 NOTE — Telephone Encounter (Signed)
 Received:  But the patient is on nexlizet  - I called [facility] and asked them to cancel the nexletol . Patient has been filling [facility] prescriptions [facility] but [facility] per the chart. I called the patient and he said he is no [facility], [facility]. [facility] said they do not have a prescription for nexlizet . Can someone send the prescription for nexlizet  to [facility]? Thank you!

## 2023-09-22 NOTE — Telephone Encounter (Signed)
 Hi, it will not let me sign the encounter. It said there is still an unsigned order. [facility] filled a old rx of nexlizet , the new order from yesterday never went. Thank you!

## 2023-09-22 NOTE — Telephone Encounter (Signed)
 Spoke to [facility] and nexlizet  is ready for pickup

## 2023-09-23 ENCOUNTER — Encounter: Payer: Self-pay | Admitting: Family Medicine

## 2023-09-23 ENCOUNTER — Ambulatory Visit: Attending: Family Medicine

## 2023-09-23 ENCOUNTER — Ambulatory Visit: Admitting: Family Medicine

## 2023-09-23 VITALS — BP 112/62 | HR 58 | Temp 97.8°F | Ht 71.0 in | Wt 163.8 lb

## 2023-09-23 DIAGNOSIS — F17200 Nicotine dependence, unspecified, uncomplicated: Secondary | ICD-10-CM

## 2023-09-23 DIAGNOSIS — R079 Chest pain, unspecified: Secondary | ICD-10-CM

## 2023-09-23 DIAGNOSIS — I2102 ST elevation (STEMI) myocardial infarction involving left anterior descending coronary artery: Secondary | ICD-10-CM | POA: Diagnosis not present

## 2023-09-23 DIAGNOSIS — R55 Syncope and collapse: Secondary | ICD-10-CM

## 2023-09-23 DIAGNOSIS — N528 Other male erectile dysfunction: Secondary | ICD-10-CM

## 2023-09-23 MED ORDER — ASPIRIN 81 MG PO TBEC: DELAYED_RELEASE_TABLET | ORAL | 1 refills | Status: AC

## 2023-09-23 MED ORDER — NITROGLYCERIN 0.4 MG SL SUBL: 0.4000 mg | SUBLINGUAL_TABLET | SUBLINGUAL | 1 refills | Status: AC | PRN

## 2023-09-23 MED ORDER — SILDENAFIL CITRATE 100 MG PO TABS: 50.0000 mg | ORAL_TABLET | Freq: Every day | ORAL | 11 refills | Status: AC | PRN

## 2023-09-23 NOTE — Addendum Note (Signed)
 Addended by: Gillermo Lack K on: 09/23/2023 03:03 PM   Modules accepted: Orders

## 2023-09-23 NOTE — Progress Notes (Unsigned)
 EP to read.
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
## 2023-09-26 NOTE — Telephone Encounter (Signed)
   Following up again.  Hi, it will not let me sign the encounter. It said there is still an unsigned order. [facility] filled a old rx of nexlizet , the new order from yesterday never went. Thank you!

## 2023-09-30 ENCOUNTER — Other Ambulatory Visit: Payer: Self-pay

## 2023-09-30 ENCOUNTER — Other Ambulatory Visit (HOSPITAL_COMMUNITY): Payer: Self-pay

## 2023-09-30 MED ORDER — NEXLIZET 180-10 MG PO TABS: 1.0000 | ORAL_TABLET | Freq: Every day | ORAL | 0 refills | Status: DC

## 2023-10-26 DIAGNOSIS — R55 Syncope and collapse: Secondary | ICD-10-CM | POA: Diagnosis not present

## 2023-10-30 DIAGNOSIS — R55 Syncope and collapse: Secondary | ICD-10-CM

## 2023-10-31 ENCOUNTER — Ambulatory Visit: Payer: Self-pay | Admitting: Family Medicine

## 2023-11-15 ENCOUNTER — Other Ambulatory Visit: Payer: Self-pay

## 2023-11-15 MED ORDER — ALPRAZOLAM 0.5 MG PO TABS: ORAL_TABLET | ORAL | 0 refills | Status: DC

## 2023-12-14 ENCOUNTER — Emergency Department (HOSPITAL_BASED_OUTPATIENT_CLINIC_OR_DEPARTMENT_OTHER): Admitting: Radiology

## 2023-12-14 ENCOUNTER — Encounter (HOSPITAL_BASED_OUTPATIENT_CLINIC_OR_DEPARTMENT_OTHER): Payer: Self-pay

## 2023-12-14 ENCOUNTER — Other Ambulatory Visit: Payer: Self-pay

## 2023-12-14 ENCOUNTER — Emergency Department (HOSPITAL_BASED_OUTPATIENT_CLINIC_OR_DEPARTMENT_OTHER)
Admission: EM | Admit: 2023-12-14 | Discharge: 2023-12-14 | Disposition: A | Discharge status: Home / Self Care | Attending: Emergency Medicine | Admitting: Emergency Medicine

## 2023-12-14 DIAGNOSIS — R079 Chest pain, unspecified: Secondary | ICD-10-CM | POA: Diagnosis not present

## 2023-12-14 DIAGNOSIS — F17200 Nicotine dependence, unspecified, uncomplicated: Secondary | ICD-10-CM | POA: Diagnosis not present

## 2023-12-14 DIAGNOSIS — I700 Atherosclerosis of aorta: Secondary | ICD-10-CM | POA: Diagnosis not present

## 2023-12-14 DIAGNOSIS — R531 Weakness: Secondary | ICD-10-CM | POA: Diagnosis not present

## 2023-12-14 DIAGNOSIS — Z7982 Long term (current) use of aspirin: Secondary | ICD-10-CM | POA: Insufficient documentation

## 2023-12-14 DIAGNOSIS — R0789 Other chest pain: Secondary | ICD-10-CM | POA: Diagnosis not present

## 2023-12-14 DIAGNOSIS — I2510 Atherosclerotic heart disease of native coronary artery without angina pectoris: Secondary | ICD-10-CM | POA: Diagnosis not present

## 2023-12-14 DIAGNOSIS — R059 Cough, unspecified: Secondary | ICD-10-CM | POA: Diagnosis not present

## 2023-12-14 DIAGNOSIS — R0602 Shortness of breath: Secondary | ICD-10-CM | POA: Diagnosis not present

## 2023-12-14 DIAGNOSIS — R5383 Other fatigue: Secondary | ICD-10-CM | POA: Insufficient documentation

## 2023-12-14 LAB — CBC
HCT: 46.9 % (ref 39.0–52.0)
Hemoglobin: 16 g/dL (ref 13.0–17.0)
MCH: 33.9 pg (ref 26.0–34.0)
MCHC: 34.1 g/dL (ref 30.0–36.0)
MCV: 99.4 fL (ref 80.0–100.0)
Platelets: 240 K/uL (ref 150–400)
RBC: 4.72 MIL/uL (ref 4.22–5.81)
RDW: 12.8 % (ref 11.5–15.5)
WBC: 8.9 K/uL (ref 4.0–10.5)
nRBC: 0 % (ref 0.0–0.2)

## 2023-12-14 LAB — BASIC METABOLIC PANEL WITH GFR
Anion gap: 14 (ref 5–15)
BUN: 22 mg/dL (ref 8–23)
CO2: 24 mmol/L (ref 22–32)
Calcium: 9.8 mg/dL (ref 8.9–10.3)
Chloride: 103 mmol/L (ref 98–111)
Creatinine, Ser: 1.1 mg/dL (ref 0.61–1.24)
GFR, Estimated: >60 mL/min (ref >60)
Glucose, Bld: 145 mg/dL — ABNORMAL HIGH (ref 70–99)
Potassium: 4 mmol/L (ref 3.5–5.1)
Sodium: 140 mmol/L (ref 135–145)

## 2023-12-14 LAB — HEPATIC FUNCTION PANEL
ALT: 37 U/L (ref 0–44)
AST: 32 U/L (ref 15–41)
Albumin: 4.6 g/dL (ref 3.5–5.0)
Alkaline Phosphatase: 88 U/L (ref 38–126)
Bilirubin, Direct: <0.1 mg/dL (ref 0.0–0.2)
Total Bilirubin: 0.3 mg/dL (ref 0.0–1.2)
Total Protein: 7.2 g/dL (ref 6.5–8.1)

## 2023-12-14 LAB — RESP PANEL BY RT-PCR (RSV, FLU A&B, COVID)  RVPGX2
Influenza A by PCR: NEGATIVE
Influenza B by PCR: NEGATIVE
Resp Syncytial Virus by PCR: NEGATIVE
SARS Coronavirus 2 by RT PCR: NEGATIVE

## 2023-12-14 LAB — MONONUCLEOSIS SCREEN: Mono Screen: NEGATIVE

## 2023-12-14 LAB — TROPONIN T, HIGH SENSITIVITY
Troponin T High Sensitivity: <15 ng/L (ref <19)
Troponin T High Sensitivity: 17 ng/L (ref <19)

## 2023-12-14 LAB — LIPASE, BLOOD: Lipase: 40 U/L (ref 11–51)

## 2023-12-14 MED ORDER — ALUM & MAG HYDROXIDE-SIMETH 200-200-20 MG/5ML PO SUSP
30.0000 mL | Freq: Once | ORAL | Status: AC
  Administered 2023-12-14: 30 mL via ORAL
  Filled 2023-12-14: qty 30

## 2023-12-14 NOTE — ED Triage Notes (Signed)
 Pt presents via POV c/o left sided chest tightness x3 days. Also reports associated SOB. Reports took nitroglycerin  SL x1 at home without relief. Also took ASA x1 at home. A&O x4 at this time.

## 2023-12-14 NOTE — ED Provider Notes (Signed)
 [facility] EMERGENCY DEPARTMENT AT [facility] Provider Note   CSN: [id number] Arrival date & time: 12/14/23  9352     Patient presents with: Chest Pain   Tony Montoya is a 63 y.o. male.   HPI     63 year old male with a history of coronary artery disease, STEMI with PCI of the LAD 2022, hyperlipidemia, tobacco abuse, depression, anxiety, GERD and IBS who presents to concern for chest pain.  Started feeling like this a little after eating lunch Monday, then yesterday AM didn't feel well, stayed home, this AM woke up and felt needed to come in. Feels like heart needs to burp. This morning coughing some as well.  Tightness coming and going since Monday, nothing seems to make it better or worse, slept most of the day yesterday, not necessarily exertional, significant fatigue and generalized weakness Not really shortness of breath No nausea or vomiting No abdominal pain nor sweating Top left abdomen/lower chest, no radiation Does feel similar to prior cardiac pain except then had left arm pain and not now No fever No leg pain or swelling No recent surgery nor immobilization, no hx of DVT/PE Still smoking (quit then restarted) 5/10 not sure if nitroglycerin  helped on the way . Mild headache.  1 baby aspirin .   Prior to Admission medications   Medication Sig Start Date End Date Taking? Authorizing Provider  ALPRAZolam  (XANAX ) 0.5 MG tablet TAKE 1 TABLET BY MOUTH THREE TIMES A DAY AS NEEDED FOR ANXIETY 11/15/23   Duanne Butler DASEN, MD  aspirin  EC 81 MG tablet TAKE 1 TABLET BY MOUTH EVERY DAY SWALLOW WHOLE 09/23/23   Duanne Butler DASEN, MD  Bempedoic Acid -Ezetimibe  (NEXLIZET ) 180-10 MG TABS Take 1 tablet by mouth daily. 09/30/23   Wonda Sharper, MD  gentamicin  cream (GARAMYCIN ) 0.1 % Apply 1 application topically 2 (two) times daily. Patient taking differently: Apply 1 application  topically as needed. 03/16/21   McDonald, Juliene SAUNDERS, DPM  metoprolol  succinate (TOPROL -XL) 25 MG 24 hr  tablet Take 0.5 tablets (12.5 mg total) by mouth daily. 09/20/23   Wonda Sharper, MD  nicotine  (NICODERM CQ  - DOSED IN MG/24 HOURS) 21 mg/24hr patch PLACE 1 PATCH ONTO THE SKIN DAILY. Patient not taking: Reported on 05/24/2023 12/16/22   Duanne Butler DASEN, MD  nitroGLYCERIN  (NITROSTAT ) 0.4 MG SL tablet Place 1 tablet (0.4 mg total) under the tongue every 5 (five) minutes as needed for chest pain. 09/23/23   Duanne Butler DASEN, MD  sildenafil  (VIAGRA ) 100 MG tablet Take 0.5-1 tablets (50-100 mg total) by mouth daily as needed for erectile dysfunction. 09/23/23   Duanne Butler DASEN, MD    Allergies: Bupropion hcl, Codeine, Repatha  [evolocumab ], Statins, Zetia  [ezetimibe ], and Zolpidem tartrate    Review of Systems  Updated Vital Signs BP 104/67   Pulse 69   Temp 98.3 F (36.8 C) (Oral)   Resp (!) 21   Ht 5' 11 (1.803 m)   Wt 74.8 kg   SpO2 93%   BMI 23.01 kg/m   Physical Exam Vitals and nursing note reviewed.  Constitutional:      General: He is not in acute distress.    Appearance: He is well-developed. He is not diaphoretic.  HENT:     Head: Normocephalic and atraumatic.  Eyes:     Conjunctiva/sclera: Conjunctivae normal.  Cardiovascular:     Rate and Rhythm: Normal rate and regular rhythm.     Heart sounds: Normal heart sounds. No murmur heard.    No friction  rub. No gallop.  Pulmonary:     Effort: Pulmonary effort is normal. No respiratory distress.     Breath sounds: Normal breath sounds. No wheezing or rales.  Abdominal:     General: There is no distension.     Palpations: Abdomen is soft.     Tenderness: There is no abdominal tenderness. There is no guarding.  Musculoskeletal:     Cervical back: Normal range of motion.  Skin:    General: Skin is warm and dry.  Neurological:     Mental Status: He is alert and oriented to person, place, and time.     (all labs ordered are listed, but only abnormal results are displayed) Labs Reviewed  BASIC METABOLIC PANEL WITH GFR  - Abnormal; Notable for the following components:      Result Value   Glucose, Bld 145 (*)    All other components within normal limits  RESP PANEL BY RT-PCR (RSV, FLU A&B, COVID)  RVPGX2  CBC  LIPASE, BLOOD  HEPATIC FUNCTION PANEL  MONONUCLEOSIS SCREEN  TROPONIN T, HIGH SENSITIVITY  TROPONIN T, HIGH SENSITIVITY    EKG: EKG Interpretation Date/Time:  Wednesday December 14 2023 06:54:34 EDT Ventricular Rate:  73 PR Interval:  245 QRS Duration:  96 QT Interval:  375 QTC Calculation: 414 R Axis:   65  Text Interpretation: Sinus rhythm Prolonged PR interval No significant change since last tracing Confirmed by Dreama Longs ([id number]) on 12/14/2023 7:04:43 AM  Radiology: [facility] Chest 2 View Result Date: 12/14/2023 EXAM: 2 VIEW XRAY OF THE CHEST 12/14/2023 07:29:00 AM COMPARISON: 2 view chest x-ray 05/19/2021. CLINICAL HISTORY: CP. Table formatting from the original note was not included. Images from the original note were not included. Pt presents via POV c/o left sided chest tightness x3 days. Also reports associated SOB. Reports took nitroglycerin  SL x1 at home without relief. Also took ASA x1 at home. A\T\O x4 at this time. FINDINGS: LUNGS AND PLEURA: No focal pulmonary opacity. No pulmonary edema. No pleural effusion. No pneumothorax. HEART AND MEDIASTINUM: No acute abnormality of the cardiac and mediastinal silhouettes. Atherosclerotic calcifications are again noted at the aortic arch. BONES AND SOFT TISSUES: No acute osseous abnormality. IMPRESSION: 1. No acute findings. 2. Atherosclerotic calcifications at the aortic arch, stable compared to 05/19/21. Electronically signed by: Lonni Necessary MD 12/14/2023 07:56 AM EDT RP Workstation: [id number]     Procedures   Medications Ordered in the ED  alum & mag hydroxide-simeth (MAALOX/MYLANTA) 200-200-20 MG/5ML suspension 30 mL (30 mLs Oral Given 12/14/23 0848)                                    Medical Decision Making Amount and/or  Complexity of Data Reviewed Labs: ordered. Radiology: ordered.  Risk OTC drugs.    63 year old male with a history of coronary artery disease, STEMI with PCI of the LAD 2022, hyperlipidemia, tobacco abuse, depression, anxiety, GERD and IBS who presents to concern for chest pain.  Differential diagnosis for chest pain includes pulmonary embolus, dissection, pneumothorax, pneumonia, ACS, myocarditis, pericarditis.    EKG was done and evaluate by me and showed no acute ST changes and no signs of pericarditis.   Chest x-ray was done and evaluated by me and radiology and showed no sign of pneumonia or pneumothorax.   Labs are completed and personally evaluated interpreted by me show no clinically significant anemia, electrolyte abnormalities.  In the setting of  cough, had COVID, flu, RSV testing that were negative.  He had a sick contact with mono and fatigue, and this test was done which was also negative.  He had some mild epigastric tenderness, lipase and hepatic function panel were completed and normal.  His troponin is undetectable and negative on recheck.  In the setting, have low suspicion for ACS.  Have low clinical suspicion for pulmonary embolus, aortic dissection by history and physical exam.   He does not have classic symptoms to suggest escalating anginal pain, without exertional symptoms and in presence of cough/fatigue may be viral syndrome. No change with nitroglycerin .  Discussed with Dr. Wonda Cardiology, will schedule close follow up. Discussed return precautions.       Final diagnoses:  Chest pain, unspecified type    ED Discharge Orders     None          Dreama Longs, MD 12/14/23 [phone]

## 2023-12-19 NOTE — Progress Notes (Signed)
 Cardiology Office Note:  .   Date:  12/20/2023  ID:  Tony Montoya, DOB December 14, 1960, MRN [id number] PCP: Duanne Butler DASEN, MD   HeartCare Providers Cardiologist:  Ozell Fell, MD {  History of Present Illness: .   Tony Montoya is a 63 y.o. male  with PMHx of CAD s/p (STEMI with PCI to LAD in 2022; Lexiscan 04/2022: low risk, evidence of prior MI), HLD, tobacco use, depression, anxiety, GERD and IBS who reports to [facility] office for hospital follow up.    Last seen in heartcare OV 11/18/2022 by Dr. Fell for follow-up.  At that time, doing well from a cardiac standpoint without any complaints.  Noted started back smoking last year when her mother died.  No medication changes.  Continued on ASA 81 mg daily, Toprol  XL 12.5 mg daily, Nexlizet  180-10 mg and NTG as needed.   Recent ED visit 12/14/2023 for chest pain.  EKG showed NSR with prolonged PR interval with no significant changes.  TN was undetectable and negative on recheck.  Low suspicion for ACS, PE, or aortic dissection based on history and physical exam.  In the presence of cough/fatigue, considered possible viral syndrome.  Discussed with Dr. Fell and schedule close follow-up. Treated with Maalox.  Today, denies any recurrent CP since hospitalization. Suspect that CP was more related to indigestion since previously on PPI but d/c. Reports heartburn episodes x1/week with burning sensation occurring after meals.  Denies any angina symptoms. Reports ongoing chronic unchanged SOB with steps and relieved with rest. Also denies any orthopnea, PND, edema, papillations, dizziness, presyncope, syncope.  Reports compliance with medications. Reports healthy heart diet with low cholesterol. 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 Currently smokes <1/2  PPD. Denies alcohol/drug use.   Initial BP 90/50 with repeat BP 100/68. Per patient and chart review, patient BP is normally on the soft side. He denies  any Iightheadness, dizziness or syncope.   ROS: 10 point review of system has been reviewed and considered negative except ones been listed in the HPI.   Studies Reviewed: SABRA   Cath 05/2020 1.  Severe single-vessel coronary artery disease with total occlusion of the mid LAD, treated successfully with primary PCI using a 2.75 x 22 mm resolute Onyx DES. 2.  Diffuse plaquing in the RCA and left circumflex without significant stenoses, ectasia of the RCA noted. 3.  Normal LVEDP   Recommendations: Post MI medical therapy.  Aggrastat  x2 hours.  Aspirin  and ticagrelor  at least 12 months without interruption.  Aggressive risk reduction measures.  Tobacco cessation counseling.  Lexiscan 04/2022   Findings are consistent with prior myocardial infarction and no prior ischemia. The study is low risk.   No ST deviation was noted.   LV perfusion is abnormal. There is no evidence of ischemia. There is evidence of infarction. Defect 1: There is a medium defect with moderate reduction in uptake present in the apical anterior, septal and apex location(s) that is fixed. There is abnormal wall motion in the defect area. Consistent with infarction and artifact.   Left ventricular function is abnormal. Global function is mildly reduced. There was a single regional abnormality. End diastolic cavity size is mildly enlarged. End systolic cavity size is normal.   Prior study not available for comparison.  ECHO 05/2022 IMPRESSIONS   1. Left ventricular ejection fraction, by estimation, is 55 to 60%. The  left ventricle has normal function. The left ventricle demonstrates  regional wall motion abnormalities (see scoring diagram/findings for  description). There is mild left ventricular   hypertrophy. Left ventricular diastolic parameters are indeterminate.  There is hypokinesis of the left ventricular, mid-apical anterior wall.   2. Right ventricular systolic function is normal. The right ventricular  size is normal.  There is normal pulmonary artery systolic pressure.   3. The mitral valve is normal in structure. Trivial mitral valve  regurgitation. No evidence of mitral stenosis.   4. The aortic valve is grossly normal. Aortic valve regurgitation is not  visualized. No aortic stenosis is present.   5. The inferior vena cava is normal in size with greater than 50%  respiratory variability, suggesting right atrial pressure of 3 mmHg.   Comparison(s): No significant change from prior study.   Zio 10/2023 NSR with sinus brady (41/min) and sinus tachy (101/min) ave 72/min. One episode  of NSVT for 9 beats at 133/min. 15 episodes of NS SVT, fastest 6 beats at 176/min, longest 27 sec at 119/min No VT, atrial fib or sustained SVT. No prolonged pauses. Symptoms associated with PVC's. Patch Wear Time:  13 days and 23 hours (2025-05-25T11:53:02-399 to 2025-06-08T11:00:33-0400)  Physical Exam:   VS:  BP 100/68 (BP Location: Right Arm, Patient Position: Sitting, Cuff Size: Normal)   Pulse (!) 55   Ht 5' 11 (1.803 m)   Wt 164 lb (74.4 kg)   BMI 22.87 kg/m    Wt Readings from Last 3 Encounters:  12/20/23 164 lb (74.4 kg)  12/14/23 165 lb (74.8 kg)  09/23/23 163 lb 12.8 oz (74.3 kg)    GEN: Well nourished, well developed in no acute distress while sitting in chair.  NECK: No JVD; No carotid bruits CARDIAC: RRR, no murmurs, rubs, gallops RESPIRATORY:  Minor wheezing throughout most likely secondary to smoking history or undiagnosed COPD; No rales or rhonchi ABDOMEN: Soft, non-tender, non-distended EXTREMITIES:  No edema; No deformity   ASSESSMENT AND PLAN: .   Coronary artery disease involving native coronary artery of native heart without angina pectoris History of placement of stent in LAD coronary artery Hyperlipidemia LDL goal <70 GERD Cath 05/2020 (PCI to mid LAD), Lexiscan 04/2022 (low risk, evidence of prior MI) - full report in studies reviewed above.  ECHO 05/2022: EF 55-60%, mild LVH,  hypokinesis (LV mid apical anterior wall), Trivial MVR 05/2023 LDL 84, 12/2023 LFT WNL  Denies any recurrent CP since hospitalization. Reports heartburn episodes x1/week with burning sensation occurring after meals.  Denies any angina symptoms. No need for further ischemic evaluation at this time. Based on presentation, most likely secondary to GERD. Order Protonix  40 mg daily.  If recurrent CP then would repeat EKG for comparison and can consider repeat Lexiscan unless CP warrant ED visit. Discussed ED precautions.   Reports ongoing chronic unchanged SOB with steps and relieved with rest. Encouraged to contact office if symptoms worsen.  Intolerant to multiple statin drugs and PCSK9 inhibitors.  Continued on ASA 81 mg daily, Toprol  XL 12.5 mg daily, Nexlizet  180-10 mg and NTG as needed.  Continue to follow with lipid clinic.  Tobacco Use Currently smokes <1/2  PPD  Dicussed cessation. He has not been using nicotine  patch. He plans to restart using patch's. He is working toward cessation.     Dispo: Follow up in 3-4 months with Dr. Copper.   Signed, Lorette CINDERELLA Kapur, PA-C

## 2023-12-20 ENCOUNTER — Encounter: Payer: Self-pay | Admitting: Physician Assistant

## 2023-12-20 ENCOUNTER — Ambulatory Visit: Attending: Physician Assistant | Admitting: Physician Assistant

## 2023-12-20 VITALS — BP 100/68 | HR 55 | Ht 71.0 in | Wt 164.0 lb

## 2023-12-20 DIAGNOSIS — E785 Hyperlipidemia, unspecified: Secondary | ICD-10-CM | POA: Diagnosis not present

## 2023-12-20 DIAGNOSIS — K219 Gastro-esophageal reflux disease without esophagitis: Secondary | ICD-10-CM | POA: Diagnosis not present

## 2023-12-20 DIAGNOSIS — I2510 Atherosclerotic heart disease of native coronary artery without angina pectoris: Secondary | ICD-10-CM

## 2023-12-20 DIAGNOSIS — Z720 Tobacco use: Secondary | ICD-10-CM

## 2023-12-20 DIAGNOSIS — Z955 Presence of coronary angioplasty implant and graft: Secondary | ICD-10-CM

## 2023-12-20 MED ORDER — PANTOPRAZOLE SODIUM 40 MG PO TBEC: 40.0000 mg | DELAYED_RELEASE_TABLET | Freq: Every day | ORAL | 1 refills | Status: DC

## 2023-12-20 NOTE — Patient Instructions (Signed)
 Medication Instructions:  START TAKING PROTONIX  40 MG DAILY.  Lab Work: NONE TO BE DONE TODAY.  Testing/Procedures: NONE  Your next appointment:   3-4 MONTHS WITH DR. WONDA  Provider:   Ozell WONDA, MD    We recommend signing up for the patient portal called MyChart.  Sign up information is provided on this After Visit Summary.  MyChart is used to connect with patients for Virtual Visits (Telemedicine).  Patients are able to view lab/test results, encounter notes, upcoming appointments, etc.  Non-urgent messages can be sent to your provider as well.   To learn more about what you can do with MyChart, go to [email or web address].

## 2023-12-21 ENCOUNTER — Encounter: Payer: Self-pay | Admitting: Physician Assistant

## 2023-12-23 ENCOUNTER — Other Ambulatory Visit (HOSPITAL_COMMUNITY): Payer: Self-pay

## 2023-12-23 ENCOUNTER — Other Ambulatory Visit: Payer: Self-pay | Admitting: Cardiovascular Disease

## 2023-12-24 ENCOUNTER — Other Ambulatory Visit: Payer: Self-pay | Admitting: Cardiovascular Disease

## 2023-12-26 ENCOUNTER — Other Ambulatory Visit (HOSPITAL_COMMUNITY): Payer: Self-pay

## 2024-01-07 ENCOUNTER — Other Ambulatory Visit: Payer: Self-pay | Admitting: Family Medicine

## 2024-01-26 ENCOUNTER — Other Ambulatory Visit: Payer: Self-pay | Admitting: Physician Assistant

## 2024-02-10 ENCOUNTER — Ambulatory Visit: Admitting: Family Medicine

## 2024-02-10 ENCOUNTER — Encounter: Payer: Self-pay | Admitting: Family Medicine

## 2024-02-10 ENCOUNTER — Other Ambulatory Visit: Payer: Self-pay

## 2024-02-10 VITALS — BP 134/74 | HR 69 | Ht 71.0 in | Wt 167.0 lb

## 2024-02-10 DIAGNOSIS — M25511 Pain in right shoulder: Secondary | ICD-10-CM

## 2024-02-10 DIAGNOSIS — S6010XA Contusion of unspecified finger with damage to nail, initial encounter: Secondary | ICD-10-CM | POA: Insufficient documentation

## 2024-02-10 DIAGNOSIS — S60121A Contusion of right index finger with damage to nail, initial encounter: Secondary | ICD-10-CM | POA: Diagnosis not present

## 2024-02-10 DIAGNOSIS — M19011 Primary osteoarthritis, right shoulder: Secondary | ICD-10-CM | POA: Diagnosis not present

## 2024-02-10 MED ORDER — DOXYCYCLINE HYCLATE 100 MG PO TABS: 100.0000 mg | ORAL_TABLET | Freq: Two times a day (BID) | ORAL | 0 refills | Status: AC
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
## 2024-02-10 NOTE — Assessment & Plan Note (Signed)
 Injection given and tolerated the procedure well, discussed icing regimen and home exercises, discussed with patient about repetitive activities and lifting mechanics.  Discussed topical anti-inflammatories.  Follow-up again 6 to 8 weeks

## 2024-02-10 NOTE — Patient Instructions (Signed)
 Ice after activity Voltaren gel Do prescribed exercises at least 3x a week Injection in shoulder today Doxycycline  prescribed See you again in 6-8 weeks

## 2024-02-10 NOTE — Assessment & Plan Note (Addendum)
 Decompression of the hematoma done with an 18-gauge needle today.  Did discuss that I did feel that an x-ray was warranted which patient declined.  Patient knows worsening symptoms to seek medical attention.  Did have some improvement in pain almost immediately after the decompression.  Due to some mild skin breakdown and it going into the weekend doxycycline  given

## 2024-02-14 ENCOUNTER — Ambulatory Visit: Admitting: Sports Medicine

## 2024-02-27 ENCOUNTER — Other Ambulatory Visit: Payer: Self-pay | Admitting: Family Medicine

## 2024-02-27 ENCOUNTER — Telehealth: Payer: Self-pay | Admitting: Physician Assistant

## 2024-02-27 ENCOUNTER — Telehealth: Payer: Self-pay

## 2024-02-27 NOTE — Telephone Encounter (Signed)
 Mychart msg sent

## 2024-02-27 NOTE — Telephone Encounter (Signed)
 Copied from CRM [id number]. Topic: Clinical - Request for Lab/Test Order >> Feb 27, 2024 12:17 PM Mia F wrote: Reason for CRM: Pt asks when is he due for his next (2nd) of his shingles shot. Please advise

## 2024-02-27 NOTE — Telephone Encounter (Signed)
 Called pt to inform him that he needed to contact his PCP to get a refill on his medication pantoprazole  and that his PCP has been refill this medication for him. I advised the pt that if he has any other problems, questions or concerns, to give our office a call back. Pt verbalized understanding.

## 2024-02-27 NOTE — Telephone Encounter (Unsigned)
 Copied from CRM #[id number]. Topic: Clinical - Medication Refill >> Feb 27, 2024 12:16 PM Mia F wrote: Medication: pantoprazole  (PROTONIX ) 40 MG tablet   Has the patient contacted their pharmacy? Yes (Agent: If no, request that the patient contact the pharmacy for the refill. If patient does not wish to contact the pharmacy document the reason why and proceed with request.) (Agent: If yes, when and what did the pharmacy advise?)  This is the patient's preferred pharmacy:  [id number] - [place], [place] - [address] AT [facility] OF [address]  [place] [address] Phone: [phone] Fax: [phone]  Is this the correct pharmacy for this prescription? Yes If no, delete pharmacy and type the correct one.   Has the prescription been filled recently? Yes  Is the patient out of the medication? Yes  Has the patient been seen for an appointment in the last year OR does the patient have an upcoming appointment? Yes  Can we respond through MyChart? Yes  Agent: Please be advised that Rx refills may take up to 3 business days. We ask that you follow-up with your pharmacy.

## 2024-02-27 NOTE — Telephone Encounter (Signed)
*  STAT* If patient is at the pharmacy, call can be transferred to refill team.   1. Which medications need to be refilled? (please list name of each medication and dose if known)   pantoprazole  (PROTONIX ) 40 MG tablet     2. Would you like to learn more about the convenience, safety, & potential cost savings by using the [facility]? No   3. Are you open to using the [facility] (Type [facility].) No   4. Which pharmacy/location (including street and city if local pharmacy) is medication to be sent to?  [id number] [place], [place] - [address] AT [facility] OF [address] Phone: [phone]  Fax: [phone]      5. Do they need a 30 day or 90 day supply? 90 day  Pt out of medication

## 2024-02-29 NOTE — Telephone Encounter (Signed)
 Requested medication (s) are due for refill today: yes  Requested medication (s) are on the active medication list: yes  Last refill:  12/20/23  Future visit scheduled: yes  Notes to clinic:  Unable to refill per protocol, last refill by another provider.      Requested Prescriptions  Pending Prescriptions Disp Refills   pantoprazole  (PROTONIX ) 40 MG tablet 30 tablet 1    Sig: Take 1 tablet (40 mg total) by mouth daily.     Gastroenterology: Proton Pump Inhibitors Passed - 02/29/2024 11:34 AM      Passed - Valid encounter within last 12 months    Recent Outpatient Visits           5 months ago Syncope, unspecified syncope type   [place] [facility] Duanne, Butler DASEN, MD   9 months ago ASCVD (arteriosclerotic cardiovascular disease)   [place] [facility] Duanne Butler DASEN, MD   1 year ago LLQ abdominal pain   [place] [facility] Duanne Butler DASEN, MD   2 years ago Prostate cancer screening   [employer] [facility] Duanne Butler DASEN, MD   9 years ago Axillary pain, right   [facility] [place] [facility], Ozell CROME, PA-C       Future Appointments             In 3 weeks Claudene Arthea HERO, DO  [place] [facility]   In 1 month Wonda Ozell, MD [facility] [place] [employer] [employer]

## 2024-03-01 ENCOUNTER — Other Ambulatory Visit: Payer: Self-pay | Admitting: Family Medicine

## 2024-03-01 ENCOUNTER — Telehealth: Payer: Self-pay | Admitting: Family Medicine

## 2024-03-01 MED ORDER — PANTOPRAZOLE SODIUM 40 MG PO TBEC: 40.0000 mg | DELAYED_RELEASE_TABLET | Freq: Every day | ORAL | 3 refills | Status: AC

## 2024-03-01 NOTE — Telephone Encounter (Unsigned)
 Copied from CRM [id number]. Topic: Clinical - Prescription Issue >> Mar 01, 2024  3:42 PM Tiffini S wrote: Reason for CRM: Patient called about medication refill for pantoprazole  (PROTONIX ) 40 MG tablet- have been out of the medication for 5 days   Patient was told that Dr. Ozell Fell office  can no longer prescribe the medication- pcp must prescribe    Prescriber not at this practice Please call the patient at [phone]

## 2024-03-01 NOTE — Telephone Encounter (Signed)
 Copied from CRM [id number]. Topic: Clinical - Medication Question >> Mar 01, 2024  8:43 AM Gustabo D wrote: pantoprazole  (PROTONIX ) 40 MG tablet Pt medication was refused and he wants to know why

## 2024-03-09 ENCOUNTER — Ambulatory Visit (INDEPENDENT_AMBULATORY_CARE_PROVIDER_SITE_OTHER)

## 2024-03-09 DIAGNOSIS — Z23 Encounter for immunization: Secondary | ICD-10-CM | POA: Diagnosis not present

## 2024-03-09 DIAGNOSIS — Z0000 Encounter for general adult medical examination without abnormal findings: Secondary | ICD-10-CM

## 2024-03-09 NOTE — Progress Notes (Signed)
 Patient is in office today for a nurse visit for Immunization. Patient Injection was given in the  Left deltoid. Patient tolerated injection well.

## 2024-03-13 ENCOUNTER — Other Ambulatory Visit: Payer: Self-pay | Admitting: Family Medicine
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
## 2024-03-22 ENCOUNTER — Ambulatory Visit: Admitting: Family Medicine

## 2024-03-22 ENCOUNTER — Encounter: Payer: Self-pay | Admitting: Family Medicine

## 2024-03-22 ENCOUNTER — Other Ambulatory Visit: Payer: Self-pay

## 2024-03-22 VITALS — BP 118/74 | Ht 71.0 in | Wt 165.0 lb

## 2024-03-22 DIAGNOSIS — M25511 Pain in right shoulder: Secondary | ICD-10-CM

## 2024-03-22 DIAGNOSIS — S6010XA Contusion of unspecified finger with damage to nail, initial encounter: Secondary | ICD-10-CM

## 2024-03-22 DIAGNOSIS — M19011 Primary osteoarthritis, right shoulder: Secondary | ICD-10-CM

## 2024-03-22 NOTE — Assessment & Plan Note (Signed)
 Significant improvement at this time.  Hold on any other type of intervention.  I do feel that there is likely going to be more intermittent pain with this over the course of time.  Patient would like to avoid surgery but has had it on the contralateral side so understands what that means.  Follow-up with me again 2 to 3 months.

## 2024-03-22 NOTE — Assessment & Plan Note (Signed)
 Patient will lose the nail on the fifth finger.  Patient will monitor.  No sign of any infectious etiology.

## 2024-03-22 NOTE — Patient Instructions (Signed)
 Good to see you! Keep hands within peripheral vision Keep doing exercises See you again in 3 months

## 2024-04-13 DIAGNOSIS — H00024 Hordeolum internum left upper eyelid: Secondary | ICD-10-CM | POA: Diagnosis not present

## 2024-04-19 ENCOUNTER — Ambulatory Visit: Attending: Internal Medicine | Admitting: Cardiovascular Disease

## 2024-04-19 ENCOUNTER — Encounter: Payer: Self-pay | Admitting: Cardiovascular Disease

## 2024-04-19 VITALS — BP 120/70 | HR 58 | Ht 71.0 in | Wt 166.6 lb

## 2024-04-19 DIAGNOSIS — E782 Mixed hyperlipidemia: Secondary | ICD-10-CM | POA: Diagnosis not present

## 2024-04-19 DIAGNOSIS — Z720 Tobacco use: Secondary | ICD-10-CM | POA: Diagnosis not present

## 2024-04-19 DIAGNOSIS — I2510 Atherosclerotic heart disease of native coronary artery without angina pectoris: Secondary | ICD-10-CM | POA: Diagnosis not present

## 2024-04-19 MED ORDER — METOPROLOL SUCCINATE ER 25 MG PO TB24: 25.0000 mg | ORAL_TABLET | Freq: Every day | ORAL | 3 refills | Status: AC

## 2024-04-19 MED ORDER — NEXLIZET 180-10 MG PO TABS: 1.0000 | ORAL_TABLET | Freq: Every day | ORAL | 3 refills | Status: AC

## 2024-04-19 NOTE — Patient Instructions (Signed)
 Medication Instructions:  Your physician recommends that you continue on your current medications as directed. Please refer to the Current Medication list given to you today.  *If you need a refill on your cardiac medications before your next appointment, please call your pharmacy*  Lab Work: None.  If you have labs (blood work) drawn today and your tests are completely normal, you will receive your results only by: MyChart Message (if you have MyChart) OR A paper copy in the mail If you have any lab test that is abnormal or we need to change your treatment, we will call you to review the results.  Testing/Procedures: None.  Follow-Up: At [facility], you and your health needs are our priority.  As part of our continuing mission to provide you with exceptional heart care, our providers are all part of one team.  This team includes your primary Cardiologist (physician) and Advanced Practice Providers or APPs (Physician Assistants and Nurse Practitioners) who all work together to provide you with the care you need, when you need it.  Your next appointment:   1 year(s)  Provider:   Ozell Fell, MD

## 2024-04-19 NOTE — Progress Notes (Signed)
 Cardiology Office Note:    Date:  04/19/2024   ID:  Tony Montoya, DOB 08-31-1960, MRN [id number]  PCP:  Duanne Butler DASEN, MD   [facility] Providers Cardiologist:  Ozell Fell, MD     Referring MD: Duanne Butler DASEN, MD   Chief Complaint  Patient presents with   Coronary Artery Disease    History of Present Illness:    Tony Montoya is a 63 y.o. male with a hx of coronary artery disease, STEMI with PCI of the LAD 2022, hyperlipidemia, and tobacco abuse, depression, anxiety, GERD and IBS.   He's here alone today. 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 Today, he denies symptoms of palpitations, chest pain, shortness of breath, orthopnea, PND, lower extremity edema, dizziness, or syncope. He continues to smoke 1/2 PPD not ready to quit. Says he's tried everything but hasn't been able to stop.    Current Medications: Active Medications[1]   Allergies:   Bupropion hcl, Codeine, Repatha  [evolocumab ], Statins, Zetia  [ezetimibe ], and Zolpidem tartrate   ROS:   Please see the history of present illness.    All other systems reviewed and are negative.  EKGs/Labs/Other Studies Reviewed:    The following studies were reviewed today: Cardiac Studies & Procedures   ______________________________________________________________________________________________ CARDIAC CATHETERIZATION  CARDIAC CATHETERIZATION 06/08/2020  Conclusion 1.  Severe single-vessel coronary artery disease with total occlusion of the mid LAD, treated successfully with primary PCI using a 2.75 x 22 mm resolute Onyx DES. 2.  Diffuse plaquing in the RCA and left circumflex without significant stenoses, ectasia of the RCA noted. 3.  Normal LVEDP  Recommendations: Post MI medical therapy.  Aggrastat  x2 hours.  Aspirin  and ticagrelor  at least 12 months without interruption.  Aggressive risk reduction measures.  Tobacco cessation counseling.  Findings Coronary  Findings Diagnostic  Dominance: Right  Left Anterior Descending Mid LAD lesion is 100% stenosed. The lesion is moderately calcified.  Left Circumflex Prox Cx lesion is 30% stenosed.  Right Coronary Artery Vessel is large. There is mild diffuse disease throughout the vessel. The RCA is a large, dominant vessel.  There is diffuse ectasia through the proximal, mid, and distal vessel.  There is diffuse plaquing throughout but no high-grade stenosis is present.  The PDA and first posterolateral branches are widely patent with no stenosis.  Intervention  Mid LAD lesion Stent CATH LAUNCHER 6FR EBU3.5 guide catheter was inserted. Lesion crossed with guidewire using a WIRE COUGAR XT STRL 190CM. Pre-stent angioplasty was performed using a BALLOON SAPPHIRE 2.5X15. A drug-eluting stent was successfully placed using a STENT RESOLUTE ONYX [id number]. Post-stent angioplasty was performed using a BALLOON SAPPHIRE [place] 3.0X15. Maximum pressure:  16 atm. Initially, there is TIMI 0 flow in the mid LAD with the occlusion located at the origin of a large second diagonal branch.  After therapeutic ACT is achieved, a cougar wire was advanced across the lesion and the lesion is predilated with a 2.5 x 15 mm balloon.  At 8 atm, there is still a waist in the balloon at an area of calcification.  At 10 atm the balloon fully expands.  The lesion was then stented with a 2.75 x 22 mm resolute Onyx DES.  That stent is postdilated with a 3.0 x 15 mm noncompliant sapphire balloon to 16 atm.  The patient tolerates the procedure well.  The large diagonal branch is wired with a prowater wire in order to protect it.  No dilatation of  the diagonal is required as there remains TIMI-3 flow without any severe ostial narrowing.  The patient tolerates the PCI portion of the procedure well without complication. Post-Intervention Lesion Assessment The intervention was successful. Pre-interventional TIMI flow is 0. Post-intervention TIMI flow is 3.  No complications occurred at this lesion. There is a 0% residual stenosis post intervention.   STRESS TESTS  MYOCARDIAL PERFUSION IMAGING 04/30/2022  Interpretation Summary   Findings are consistent with prior myocardial infarction and no prior ischemia. The study is low risk.   No ST deviation was noted.   LV perfusion is abnormal. There is no evidence of ischemia. There is evidence of infarction. Defect 1: There is a medium defect with moderate reduction in uptake present in the apical anterior, septal and apex location(s) that is fixed. There is abnormal wall motion in the defect area. Consistent with infarction and artifact.   Left ventricular function is abnormal. Global function is mildly reduced. There was a single regional abnormality. End diastolic cavity size is mildly enlarged. End systolic cavity size is normal.   Prior study not available for comparison.   ECHOCARDIOGRAM  ECHOCARDIOGRAM COMPLETE 05/21/2022  Narrative ECHOCARDIOGRAM REPORT    Patient Name:   Tony Montoya Date of Exam: 05/21/2022 Medical Rec #:  [id number]       Height:       71.0 in Accession #:    [id number]      Weight:       170.0 lb Date of Birth:  18-Jul-1960        BSA:          1.968 m Patient Age:    61 years        BP:           104/60 mmHg Patient Gender: M               HR:           51 bpm. Exam Location:  [address]  Procedure: 2D Echo, Cardiac Doppler and Color Doppler  Indications:    R06.00 SOB  History:        Patient has prior history of Echocardiogram examinations, most recent 09/16/2020. Previous Myocardial Infarction and CAD, Signs/Symptoms:Shortness of Breath and Fatigue; Risk Factors:Dyslipidemia and Former Smoker.  Sonographer:    Elsie Bohr RDCS Referring Phys: [phone] JENNIFER C WOODY  IMPRESSIONS   1. Left ventricular ejection fraction, by estimation, is 55 to 60%. The left ventricle has normal function. The left ventricle demonstrates regional wall motion  abnormalities (see scoring diagram/findings for description). There is mild left ventricular hypertrophy. Left ventricular diastolic parameters are indeterminate. There is hypokinesis of the left ventricular, mid-apical anterior wall. 2. Right ventricular systolic function is normal. The right ventricular size is normal. There is normal pulmonary artery systolic pressure. 3. The mitral valve is normal in structure. Trivial mitral valve regurgitation. No evidence of mitral stenosis. 4. The aortic valve is grossly normal. Aortic valve regurgitation is not visualized. No aortic stenosis is present. 5. The inferior vena cava is normal in size with greater than 50% respiratory variability, suggesting right atrial pressure of 3 mmHg.  Comparison(s): No significant change from prior study.  FINDINGS Left Ventricle: Left ventricular ejection fraction, by estimation, is 55 to 60%. The left ventricle has normal function. The left ventricle demonstrates regional wall motion abnormalities. The left ventricular internal cavity size was normal in size. There is mild left ventricular hypertrophy. Left ventricular diastolic parameters are indeterminate.  Right Ventricle:  The right ventricular size is normal. No increase in right ventricular wall thickness. Right ventricular systolic function is normal. There is normal pulmonary artery systolic pressure. The tricuspid regurgitant velocity is 1.60 m/s, and with an assumed right atrial pressure of 3 mmHg, the estimated right ventricular systolic pressure is 13.2 mmHg.  Left Atrium: Left atrial size was normal in size.  Right Atrium: Right atrial size was normal in size.  Pericardium: There is no evidence of pericardial effusion.  Mitral Valve: The mitral valve is normal in structure. Trivial mitral valve regurgitation. No evidence of mitral valve stenosis.  Tricuspid Valve: The tricuspid valve is normal in structure. Tricuspid valve regurgitation is trivial. No  evidence of tricuspid stenosis.  Aortic Valve: The aortic valve is grossly normal. Aortic valve regurgitation is not visualized. No aortic stenosis is present.  Pulmonic Valve: The pulmonic valve was grossly normal. Pulmonic valve regurgitation is mild. No evidence of pulmonic stenosis.  Aorta: The aortic root, ascending aorta, aortic arch and descending aorta are all structurally normal, with no evidence of dilitation or obstruction.  Venous: The inferior vena cava is normal in size with greater than 50% respiratory variability, suggesting right atrial pressure of 3 mmHg.  IAS/Shunts: The atrial septum is grossly normal.   LEFT VENTRICLE PLAX 2D LVIDd:         4.40 cm   Diastology LVIDs:         3.00 cm   LV e' medial:    5.87 cm/s LV PW:         1.20 cm   LV E/e' medial:  10.2 LV IVS:        1.30 cm   LV e' lateral:   10.00 cm/s LVOT diam:     2.10 cm   LV E/e' lateral: 6.0 LV SV:         73 LV SV Index:   37 LVOT Area:     3.46 cm   RIGHT VENTRICLE             IVC RV S prime:     11.60 cm/s  IVC diam: 1.20 cm TAPSE (M-mode): 1.8 cm RVSP:           13.2 mmHg  LEFT ATRIUM             Index        RIGHT ATRIUM           Index LA diam:        3.30 cm 1.68 cm/m   RA Pressure: 3.00 mmHg LA Vol (A2C):   70.8 ml 35.98 ml/m  RA Area:     13.40 cm LA Vol (A4C):   30.2 ml 15.35 ml/m  RA Volume:   34.70 ml  17.63 ml/m LA Biplane Vol: 51.0 ml 25.92 ml/m AORTIC VALVE LVOT Vmax:   97.40 cm/s LVOT Vmean:  62.600 cm/s LVOT VTI:    0.211 m  AORTA Ao Sinus diam: 4.20 cm Ao Asc diam:   3.70 cm  MITRAL VALVE               TRICUSPID VALVE MV Area (PHT): 1.87 cm    TR Peak grad:   10.2 mmHg MV Decel Time: 405 msec    TR Vmax:        160.00 cm/s MV E velocity: 59.70 cm/s  Estimated RAP:  3.00 mmHg MV A velocity: 83.80 cm/s  RVSP:           13.2 mmHg MV E/A  ratio:  0.71 SHUNTS Systemic VTI:  0.21 m Systemic Diam: 2.10 cm  Shelda Bruckner MD Electronically signed by  Shelda Bruckner MD Signature Date/Time: 05/21/2022/2:59:43 PM    Final    MONITORS  LONG TERM MONITOR (3-14 DAYS) 10/26/2023  Narrative NSR with sinus brady (41/min) and sinus tachy (101/min) ave 72/min. One episode  of NSVT for 9 beats at 133/min. 15 episodes of NS SVT, fastest 6 beats at 176/min, longest 27 sec at 119/min No VT, atrial fib or sustained SVT. No prolonged pauses. Symptoms associated with PVC's. Patch Wear Time:  13 days and 23 hours (2025-05-25T11:53:02-399 to 2025-06-08T11:00:33-0400)       ______________________________________________________________________________________________      EKG:        Recent Labs: 05/24/2023: TSH 0.42 12/14/2023: ALT 37; BUN 22; Creatinine, Ser 1.10; Hemoglobin 16.0; Platelets 240; Potassium 4.0; Sodium 140  Recent Lipid Panel    Component Value Date/Time   CHOL 165 05/24/2023 1615   CHOL 153 01/19/2022 0817   TRIG 63 05/24/2023 1615   TRIG 162 (H) 03/10/2006 1148   HDL 67 05/24/2023 1615   HDL 56 01/19/2022 0817   CHOLHDL 2.5 05/24/2023 1615   VLDL 14 06/09/2020 1159   LDLCALC 84 05/24/2023 1615   LDLDIRECT 99 07/21/2018 1630     Risk Assessment/Calculations:           STOP-Bang Score:          Physical Exam:    VS:  BP 120/70 (BP Location: Left Arm, Patient Position: Sitting, Cuff Size: Normal)   Pulse (!) 58   Ht 5' 11 (1.803 m)   Wt 166 lb 9.6 oz (75.6 kg)   SpO2 97%   BMI 23.24 kg/m     Wt Readings from Last 3 Encounters:  04/19/24 166 lb 9.6 oz (75.6 kg)  03/22/24 165 lb (74.8 kg)  02/10/24 167 lb (75.8 kg)     GEN:  Well nourished, well developed in no acute distress HEENT: Normal NECK: No JVD; No carotid bruits LYMPHATICS: No lymphadenopathy CARDIAC: RRR, no murmurs, rubs, gallops RESPIRATORY:  Clear to auscultation without rales, wheezing or rhonchi  ABDOMEN: Soft, non-tender, non-distended MUSCULOSKELETAL:  No edema; No deformity  SKIN: Warm and dry NEUROLOGIC:  Alert and  oriented x 3 PSYCHIATRIC:  Normal affect   Assessment & Plan Coronary artery disease involving native coronary artery of native heart without angina pectoris Patient is stable without symptoms of angina.  He will continue on aspirin  for antiplatelet therapy, metoprolol  succinate, and bempedoic acid . Tobacco use Cessation counseling done.  Not ready to quit.  Reviewed all the things he has tried in the past.  This is a very difficult habit for him to break. Mixed hyperlipidemia Intolerant of statins and PCSK9 inhibitors.  Has had side effects from ezetimibe  but seems to tolerate next Lizette.  Continue current management.            Medication Adjustments/Labs and Tests Ordered: Current medicines are reviewed at length with the patient today.  Concerns regarding medicines are outlined above.  No orders of the defined types were placed in this encounter.  Meds ordered this encounter  Medications   metoprolol  succinate (TOPROL -XL) 25 MG 24 hr tablet    Sig: Take 1 tablet (25 mg total) by mouth daily.    Dispense:  45 tablet    Refill:  3   Bempedoic Acid -Ezetimibe  (NEXLIZET ) 180-10 MG TABS    Sig: Take 1 tablet by mouth daily.    Dispense:  90 tablet    Refill:  3    Patient Instructions  Medication Instructions:  Your physician recommends that you continue on your current medications as directed. Please refer to the Current Medication list given to you today.  *If you need a refill on your cardiac medications before your next appointment, please call your pharmacy*  Lab Work: None.  If you have labs (blood work) drawn today and your tests are completely normal, you will receive your results only by: MyChart Message (if you have MyChart) OR A paper copy in the mail If you have any lab test that is abnormal or we need to change your treatment, we will call you to review the results.  Testing/Procedures: None.  Follow-Up: At [facility], you and your health  needs are our priority.  As part of our continuing mission to provide you with exceptional heart care, our providers are all part of one team.  This team includes your primary Cardiologist (physician) and Advanced Practice Providers or APPs (Physician Assistants and Nurse Practitioners) who all work together to provide you with the care you need, when you need it.  Your next appointment:   1 year(s)  Provider:   Ozell Fell, MD              Signed, Ozell Fell, MD  04/19/2024 10:07 AM    [facility]     [1]  Current Meds  Medication Sig   ALPRAZolam  (XANAX ) 0.5 MG tablet TAKE 1 TABLET BY MOUTH THREE TIMES DAILY AS NEEDED FOR ANXIETY (Patient taking differently: Take 0.5 mg by mouth 3 (three) times daily as needed. for anxiety Per patient taking 1/2 tablet at night)   aspirin  EC 81 MG tablet TAKE 1 TABLET BY MOUTH EVERY DAY SWALLOW WHOLE   doxycycline  (VIBRA -TABS) 100 MG tablet Take 1 tablet (100 mg total) by mouth 2 (two) times daily.   gentamicin  cream (GARAMYCIN ) 0.1 % Apply 1 application topically 2 (two) times daily. (Patient taking differently: Apply 1 application  topically as needed.)   nicotine  (NICODERM CQ  - DOSED IN MG/24 HOURS) 21 mg/24hr patch PLACE 1 PATCH ONTO THE SKIN DAILY.   nitroGLYCERIN  (NITROSTAT ) 0.4 MG SL tablet Place 1 tablet (0.4 mg total) under the tongue every 5 (five) minutes as needed for chest pain.   pantoprazole  (PROTONIX ) 40 MG tablet Take 1 tablet (40 mg total) by mouth daily.   sildenafil  (VIAGRA ) 100 MG tablet Take 0.5-1 tablets (50-100 mg total) by mouth daily as needed for erectile dysfunction.   [DISCONTINUED] Bempedoic Acid -Ezetimibe  (NEXLIZET ) 180-10 MG TABS TAKE 1 TABLET BY MOUTH EVERY DAY   [DISCONTINUED] metoprolol  succinate (TOPROL -XL) 25 MG 24 hr tablet TAKE 1/2 TABLET(12.5 MG) BY MOUTH DAILY

## 2024-04-19 NOTE — Assessment & Plan Note (Signed)
 Intolerant of statins and PCSK9 inhibitors.  Has had side effects from ezetimibe  but seems to tolerate next Tony Montoya.  Continue current management.

## 2024-05-23 ENCOUNTER — Telehealth: Payer: Self-pay

## 2024-05-23 NOTE — Telephone Encounter (Signed)
 Copied from CRM [id number]. Topic: General - Other >> May 23, 2024 12:17 PM Tony Montoya wrote: Reason for CRM: Patient states he is needing a letter from PCP stating he is good to do a respirator fit test. Please reach out and advise # [phone]

## 2024-05-24 ENCOUNTER — Encounter: Payer: Self-pay | Admitting: Family Medicine

## 2024-05-25 ENCOUNTER — Other Ambulatory Visit: Payer: Self-pay | Admitting: Family Medicine

## 2024-05-25 ENCOUNTER — Telehealth: Payer: Self-pay

## 2024-05-25 MED ORDER — ALPRAZOLAM 0.5 MG PO TABS: 0.5000 mg | ORAL_TABLET | Freq: Three times a day (TID) | ORAL | 0 refills | Status: AC | PRN

## 2024-05-25 NOTE — Telephone Encounter (Signed)
 Pt came into office to request a refill of this med: ALPRAZolam  (XANAX ) 0.5 MG tablet [[id number]]     LOV: 0516/25  PHARMACY:  [id number] [place], [place] - [address] AT [facility] [address] [place] [address] Phone: [phone]  Fax: [phone]    CB#: [phone]

## 2024-06-26 ENCOUNTER — Ambulatory Visit: Admitting: Family Medicine
